# Patient Record
Sex: Male | Born: 1980
Health system: Southern US, Community
[De-identification: ages and names within clinical notes are randomized; demographics above are authoritative.]

## PROBLEM LIST (undated history)

## (undated) DIAGNOSIS — F909 Attention-deficit hyperactivity disorder, unspecified type: Secondary | ICD-10-CM

## (undated) DIAGNOSIS — I10 Essential (primary) hypertension: Secondary | ICD-10-CM

## (undated) HISTORY — DX: Essential (primary) hypertension: I10

## (undated) HISTORY — DX: Attention-deficit hyperactivity disorder, unspecified type: F90.9

---

## 2016-05-17 HISTORY — PX: ROTATOR CUFF REPAIR: SHX139

## 2017-06-01 ENCOUNTER — Emergency Department
Admission: EM | Admit: 2017-06-01 | Discharge: 2017-06-01 | Disposition: A | Discharge status: Home / Self Care | Payer: BLUE CROSS/BLUE SHIELD | Source: Home / Self Care | Attending: Family Medicine | Admitting: Family Medicine

## 2017-06-01 ENCOUNTER — Encounter: Payer: Self-pay | Admitting: Emergency Medicine

## 2017-06-01 ENCOUNTER — Other Ambulatory Visit: Payer: Self-pay

## 2017-06-01 ENCOUNTER — Emergency Department (INDEPENDENT_AMBULATORY_CARE_PROVIDER_SITE_OTHER): Payer: BLUE CROSS/BLUE SHIELD

## 2017-06-01 DIAGNOSIS — M79672 Pain in left foot: Secondary | ICD-10-CM

## 2017-06-01 DIAGNOSIS — S90222A Contusion of left lesser toe(s) with damage to nail, initial encounter: Secondary | ICD-10-CM

## 2017-06-01 DIAGNOSIS — M79675 Pain in left toe(s): Secondary | ICD-10-CM

## 2017-06-01 NOTE — ED Triage Notes (Signed)
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

## 2017-06-01 NOTE — ED Provider Notes (Signed)
[facility]    CSN: [id number] Arrival date & time: 06/01/17  1309     History   Chief Complaint Chief Complaint  Patient presents with  . Toe Injury    HPI Salley SlaughterDavid L Engen is a 37 y.o. male.   HPI  Salley SlaughterDavid L Malizia is a 37 y.o. male presenting to UC with c/o Left 4th toe pain, swelling, and bruising that started about 3 days ago after accidentally hitting it on a piece of luggage.  Pain is worse with ambulation, 6/10.  No prior fracture or surgery to same toe.  He has tried ice, heat, Tylenol and Motrin with mild relief.    History reviewed. No pertinent past medical history.  There are no active problems to display for this patient.   History reviewed. No pertinent surgical history.     Home Medications    Prior to Admission medications   Not on File    Family History History reviewed. No pertinent family history.  Social History Social History   Tobacco Use  . Smoking status: Never Smoker  . Smokeless tobacco: Never Used  Substance Use Topics  . Alcohol use: No    Frequency: Never  . Drug use: No     Allergies   Patient has no known allergies.   Review of Systems Review of Systems  Musculoskeletal: Positive for arthralgias and joint swelling. Negative for myalgias.  Skin: Positive for color change. Negative for rash and wound.  Neurological: Negative for weakness and numbness.     Physical Exam Triage Vital Signs ED Triage Vitals  Enc Vitals Group     BP 06/01/17 1335 135/72     Pulse Rate 06/01/17 1335 (!) 102     Resp --      Temp 06/01/17 1335 97.9 F (36.6 C)     Temp Source 06/01/17 1335 Oral     SpO2 06/01/17 1335 98 %     Weight 06/01/17 1336 272 lb (123.4 kg)     Height 06/01/17 1336 6\' 1"  (1.854 m)     Head Circumference --      Peak Flow --      Pain Score 06/01/17 1337 6     Pain Loc --      Pain Edu? --      Excl. in GC? --    No data found.  Updated Vital Signs BP 135/72 (BP Location: Right  Arm)   Pulse (!) 102   Temp 97.9 F (36.6 C) (Oral)   Ht 6\' 1"  (1.854 m)   Wt 272 lb (123.4 kg)   SpO2 98%   BMI 35.89 kg/m   Visual Acuity Right Eye Distance:   Left Eye Distance:   Bilateral Distance:    Right Eye Near:   Left Eye Near:    Bilateral Near:     Physical Exam  Constitutional: He is oriented to person, place, and time. He appears well-developed and well-nourished. No distress.  HENT:  Head: Normocephalic and atraumatic.  Eyes: EOM are normal.  Neck: Normal range of motion.  Cardiovascular: Normal rate.  Pulmonary/Chest: Effort normal.  Musculoskeletal: Normal range of motion. He exhibits edema and tenderness.  Left 4th toe: mild edema, tenderness to distal aspect. No obvious deformity. Full ROM  Neurological: He is alert and oriented to person, place, and time.  Skin: Skin is warm and dry. Capillary refill takes less than 2 seconds. He is not diaphoretic.  Left 4th toe: skin in tact. Ecchymosis noted  to distal aspect.   Psychiatric: He has a normal mood and affect. His behavior is normal.  Nursing note and vitals reviewed.    UC Treatments / Results  Labs (all labs ordered are listed, but only abnormal results are displayed) Labs Reviewed - No data to display  EKG  EKG Interpretation None       Radiology Dg Foot Complete Left  Result Date: 06/01/2017 CLINICAL DATA:  Pain after kicking suitcase EXAM: LEFT FOOT - COMPLETE 3+ VIEW COMPARISON:  None. FINDINGS: Frontal, oblique, and lateral views were obtained. There is no evident fracture or dislocation. Joint spaces appear normal. There is bony overgrowth along the dorsal midfoot. No erosive change. IMPRESSION: Bony overgrowth along dorsal midfoot. No fracture or dislocation. No appreciable joint space narrowing or erosion. Electronically Signed   By: Bretta Bang III M.D.   On: 06/01/2017 13:55    Procedures Procedures (including critical care time)  Medications Ordered in UC Medications -  No data to display   Initial Impression / Assessment and Plan / UC Course  I have reviewed the triage vital signs and the nursing notes.  Pertinent labs & imaging results that were available during my care of the patient were reviewed by me and considered in my medical decision making (see chart for details).     Hx of Left 4th toe pain, bruising and swelling noted on exam. No evidence of fracture or dislocation.   Final Clinical Impressions(s) / UC Diagnoses   Final diagnoses:  Toenail bruise, left, initial encounter  Pain of toe of left foot    ED Discharge Orders    None       Controlled Substance Prescriptions  Controlled Substance Registry consulted? Not Applicable   Rolla Plate 06/01/17 1610

## 2018-03-29 ENCOUNTER — Emergency Department (HOSPITAL_COMMUNITY)
Admission: EM | Admit: 2018-03-29 | Discharge: 2018-03-29 | Disposition: A | Discharge status: Home / Self Care | Payer: Federal, State, Local not specified - PPO | Attending: Emergency Medicine | Admitting: Emergency Medicine

## 2018-03-29 ENCOUNTER — Emergency Department (HOSPITAL_COMMUNITY): Payer: Federal, State, Local not specified - PPO

## 2018-03-29 ENCOUNTER — Other Ambulatory Visit: Payer: Self-pay

## 2018-03-29 DIAGNOSIS — S61111A Laceration without foreign body of right thumb with damage to nail, initial encounter: Secondary | ICD-10-CM | POA: Insufficient documentation

## 2018-03-29 DIAGNOSIS — Y929 Unspecified place or not applicable: Secondary | ICD-10-CM | POA: Insufficient documentation

## 2018-03-29 DIAGNOSIS — W268XXA Contact with other sharp object(s), not elsewhere classified, initial encounter: Secondary | ICD-10-CM | POA: Diagnosis not present

## 2018-03-29 DIAGNOSIS — Y939 Activity, unspecified: Secondary | ICD-10-CM | POA: Insufficient documentation

## 2018-03-29 DIAGNOSIS — S62525A Nondisplaced fracture of distal phalanx of left thumb, initial encounter for closed fracture: Secondary | ICD-10-CM | POA: Diagnosis not present

## 2018-03-29 DIAGNOSIS — S61011A Laceration without foreign body of right thumb without damage to nail, initial encounter: Secondary | ICD-10-CM | POA: Diagnosis not present

## 2018-03-29 DIAGNOSIS — Y999 Unspecified external cause status: Secondary | ICD-10-CM | POA: Insufficient documentation

## 2018-03-29 DIAGNOSIS — S62521B Displaced fracture of distal phalanx of right thumb, initial encounter for open fracture: Secondary | ICD-10-CM | POA: Diagnosis not present

## 2018-03-29 HISTORY — PX: OTHER SURGICAL HISTORY: SHX169

## 2018-03-29 MED ORDER — MORPHINE SULFATE (PF) 4 MG/ML IV SOLN
4.0000 mg | Freq: Once | INTRAVENOUS | Status: AC
  Administered 2018-03-29: 4 mg via INTRAVENOUS
  Filled 2018-03-29: qty 1

## 2018-03-29 MED ORDER — CEFAZOLIN SODIUM-DEXTROSE 1-4 GM/50ML-% IV SOLN
1.0000 g | Freq: Once | INTRAVENOUS | Status: AC
  Administered 2018-03-29: 1 g via INTRAVENOUS
  Filled 2018-03-29: qty 50

## 2018-03-29 MED ORDER — LIDOCAINE HCL 2 % IJ SOLN
10.0000 mL | Freq: Once | INTRAMUSCULAR | Status: AC
  Administered 2018-03-29: 200 mg
  Filled 2018-03-29: qty 20

## 2018-03-29 MED ORDER — OXYCODONE HCL 5 MG PO TABS: 0.0000 mg | ORAL_TABLET | Freq: Four times a day (QID) | ORAL | 0 refills | Status: DC | PRN

## 2018-03-29 MED ORDER — CEPHALEXIN 500 MG PO CAPS: 500.0000 mg | ORAL_CAPSULE | Freq: Four times a day (QID) | ORAL | 0 refills | Status: AC

## 2018-03-29 MED ORDER — IBUPROFEN 200 MG PO TABS: 600.0000 mg | ORAL_TABLET | Freq: Four times a day (QID) | ORAL | Status: DC

## 2018-03-29 MED ORDER — BUPIVACAINE HCL (PF) 0.5 % IJ SOLN
10.0000 mL | Freq: Once | INTRAMUSCULAR | Status: AC
  Administered 2018-03-29: 10 mL
  Filled 2018-03-29: qty 30

## 2018-03-29 MED ORDER — ACETAMINOPHEN 325 MG PO TABS: 650.0000 mg | ORAL_TABLET | Freq: Four times a day (QID) | ORAL | Status: DC

## 2018-03-29 MED ORDER — IBUPROFEN 200 MG PO TABS
600.0000 mg | ORAL_TABLET | Freq: Once | ORAL | Status: AC
  Administered 2018-03-29: 600 mg via ORAL
  Filled 2018-03-29: qty 3

## 2018-03-29 MED ORDER — ONDANSETRON HCL 4 MG/2ML IJ SOLN
4.0000 mg | Freq: Once | INTRAMUSCULAR | Status: AC
  Administered 2018-03-29: 4 mg via INTRAVENOUS
  Filled 2018-03-29: qty 2

## 2018-03-29 NOTE — ED Triage Notes (Signed)
Pt arriving from home with laceration to right thumb. Pt has string tied around thumb at this time to hold pressure.

## 2018-03-29 NOTE — Discharge Instructions (Signed)
Discharge Instructions ° ° °You have a dressing with a splint incorporated in it. °Move your fingers as much as possible, making a full fist and fully opening the fist. °Elevate your hand to reduce pain & swelling of the digits.  Ice over the operative site may be helpful to reduce pain & swelling.  DO NOT USE HEAT. °Leave the dressing in place until you return to our office.  °You may shower, but keep the bandage clean & dry.  °You may drive a car when you are off of prescription pain medications and can safely control your vehicle with both hands. °Our office will call you to arrange follow-up ° ° °Please call [phone] during normal business hours or [phone] after hours for any problems. Including the following: ° °- excessive redness of the incisions °- drainage for more than 4 days °- fever of more than 101.5 F ° °*Please note that pain medications will not be refilled after hours or on weekends. ° ° °

## 2018-03-29 NOTE — ED Provider Notes (Signed)
[facility]-EMERGENCY DEPT Provider Note   CSN: [id number] Arrival date & time: 03/29/18  1856     History   Chief Complaint Chief Complaint  Patient presents with  . Laceration    right thumb    HPI Brett Brown is a 37 y.o. male.  HPI   Brett SlaughterDavid L Service is a 37 y.o. male, patient with no pertinent past medical history, presenting to the ED with laceration to the right thumb that occurred around 5:30 PM today. He was repairing the fan of a [employer] when it turned on.  He placed a tourniquet made from a shoestring and was able to control the bleeding. Pain is severe, sharp, nonradiating.  Tetanus updated 3 years ago. Denies numbness, other injuries.   No past medical history on file.  There are no active problems to display for this patient.   No past surgical history on file.      Home Medications    Prior to Admission medications   Medication Sig Start Date End Date Taking? Authorizing Provider  acetaminophen (TYLENOL) 325 MG tablet Take 2 tablets (650 mg total) by mouth every 6 (six) hours. 03/29/18   Mack Hookhompson, Jacobi, MD  cephALEXin (KEFLEX) 500 MG capsule Take 1 capsule (500 mg total) by mouth 4 (four) times daily for 5 days. 03/29/18 04/03/18  Mack Hookhompson, Eber, MD  ibuprofen (ADVIL) 200 MG tablet Take 3 tablets (600 mg total) by mouth every 6 (six) hours. 03/29/18   Mack Hookhompson, Djimon, MD  oxyCODONE (ROXICODONE) 5 MG immediate release tablet Take 0-1 tablets (0-5 mg total) by mouth every 6 (six) hours as needed for severe pain. 03/29/18   Mack Hookhompson, Azad, MD    Family History No family history on file.  Social History Social History   Tobacco Use  . Smoking status: Never Smoker  . Smokeless tobacco: Never Used  Substance Use Topics  . Alcohol use: No    Frequency: Never  . Drug use: No     Allergies   Patient has no known allergies.   Review of Systems Review of Systems  Skin: Positive for wound.  Neurological:  Negative for numbness.     Physical Exam Updated Vital Signs BP (!) 160/109 (BP Location: Left Arm)   Pulse (!) 123   Temp 97.9 F (36.6 C) (Oral)   Resp 18   SpO2 97%   Physical Exam  Constitutional: He appears well-developed and well-nourished. No distress.  HENT:  Head: Normocephalic and atraumatic.  Eyes: Conjunctivae are normal.  Neck: Neck supple.  Cardiovascular: Normal rate, regular rhythm and intact distal pulses.  Pulmonary/Chest: Effort normal.  Musculoskeletal:  Severe laceration with partial amputation/avulsion of distal thumb section on the right hand.  Neurological: He is alert.  Patient has sensation grossly intact to light touch throughout the right thumb.  Motor function intact at the IP and MCP joints.  Skin: Skin is warm and dry. Capillary refill takes less than 2 seconds. He is not diaphoretic. No pallor.  Psychiatric: He has a normal mood and affect. His behavior is normal.  Nursing note and vitals reviewed.                   ED Treatments / Results  Labs (all labs ordered are listed, but only abnormal results are displayed) Labs Reviewed - No data to display  EKG None  Radiology Dg Finger Thumb Right  Result Date: 03/29/2018 CLINICAL DATA:  Right thumb laceration EXAM: RIGHT THUMB 2+V COMPARISON:  None. FINDINGS: Soft tissue laceration of right thumb. Oblique displaced fracture of the radial aspect of the first distal phalanx involving the tuft. 4 mm of radial and 4 mm of volar displacement of the major fracture fragment. No other fracture or dislocation. IMPRESSION: 1. Oblique displaced fracture of the radial aspect of the first distal phalanx involving the tuft. 4 mm of radial and 4 mm of volar displacement of the major fracture fragment. 2. Overlying soft tissue laceration of the right thumb. Electronically Signed   By: Elige Ko   On: 03/29/2018 20:53    Procedures .Nerve Block Date/Time: 03/29/2018 7:50 PM Performed by:  Anselm Pancoast, PA-C Authorized by: Anselm Pancoast, PA-C   Consent:    Consent obtained:  Verbal   Consent given by:  Patient   Risks discussed:  Bleeding, infection, pain, swelling and unsuccessful block Indications:    Indications:  Pain relief and procedural anesthesia Location:    Body area:  Upper extremity   Upper extremity nerve blocked: Digital; thumb.   Laterality:  Right Pre-procedure details:    Skin preparation:  Alcohol Procedure details (see MAR for exact dosages):    Block needle gauge:  25 G   Anesthetic injected:  Bupivacaine 0.5% w/o epi   Injection procedure:  Anatomic landmarks identified, anatomic landmarks palpated, incremental injection, introduced needle and negative aspiration for blood Post-procedure details:    Outcome:  Anesthesia achieved   Patient tolerance of procedure:  Tolerated well, no immediate complications   (including critical care time)  Medications Ordered in ED Medications  bupivacaine (MARCAINE) 0.5 % injection 10 mL (10 mLs Infiltration Given by Other 03/29/18 1950)  ceFAZolin (ANCEF) IVPB 1 g/50 mL premix (0 g Intravenous Stopped 03/29/18 2152)  lidocaine (XYLOCAINE) 2 % (with pres) injection 200 mg (200 mg Infiltration Given by Other 03/29/18 2125)  morphine 4 MG/ML injection 4 mg (4 mg Intravenous Given 03/29/18 2226)  ibuprofen (ADVIL,MOTRIN) tablet 600 mg (600 mg Oral Given 03/29/18 2226)  ondansetron (ZOFRAN) injection 4 mg (4 mg Intravenous Given 03/29/18 2226)     Initial Impression / Assessment and Plan / ED Course  I have reviewed the triage vital signs and the nursing notes.  Pertinent labs & imaging results that were available during my care of the patient were reviewed by me and considered in my medical decision making (see chart for details).  Clinical Course as of Mar 30 2303  Wed Mar 29, 2018  2006 Put in call back request for Dr. Janee Morn.   [SJ]  2025 Spoke with Dr. Janee Morn, hand surgeon. States he is on his way  in to evaluate the patient.   [SJ]    Clinical Course User Index [SJ] ,  C, PA-C    Patient presents with severe laceration to the right thumb.  Options for repair were discussed with the patient by Dr. Janee Morn, hand surgeon.  Patient opted for bedside repair, which was performed by Dr. Janee Morn. The patient was given instructions for home care as well as return precautions. Patient voices understanding of these instructions, accepts the plan, and is comfortable with discharge.  Findings and plan of care discussed with Kristine Royal, MD.   Patient's hypertension here is noted.  He does not seem to be symptomatic to it.  He states he is quite anxious about his hand.  He will follow-up with a primary care provider on this matter.   Final Clinical Impressions(s) / ED Diagnoses   Final diagnoses:  Laceration of  right thumb without foreign body with damage to nail, initial encounter    ED Discharge Orders         Ordered    oxyCODONE (ROXICODONE) 5 MG immediate release tablet  Every 6 hours PRN     03/29/18 2149    ibuprofen (ADVIL) 200 MG tablet  Every 6 hours     03/29/18 2149    acetaminophen (TYLENOL) 325 MG tablet  Every 6 hours     03/29/18 2149    cephALEXin (KEFLEX) 500 MG capsule  4 times daily     03/29/18 2149           Anselm Pancoast, PA-C 03/29/18 2308    Wynetta Fines, MD 03/29/18 [phone]

## 2018-03-29 NOTE — Consult Note (Signed)
ORTHOPAEDIC CONSULTATION HISTORY & PHYSICAL REQUESTING PHYSICIAN: Wynetta Fines, MD  Chief Complaint: R thumb injury  HPI: Brett Brown is a 37 y.o. 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  At the time of injury, he placed a tourniquet about the thumb to prevent blood loss and arrived to the ED via POV.  No past medical history on file. No past surgical history on file. Social History   Socioeconomic History  . Marital status: Single    Spouse name: Not on file  . Number of children: Not on file  . Years of education: Not on file  . Highest education level: Not on file  Occupational History  . Not on file  Social Needs  . Financial resource strain: Not on file  . Food insecurity:    Worry: Not on file    Inability: Not on file  . Transportation needs:    Medical: Not on file    Non-medical: Not on file  Tobacco Use  . Smoking status: Never Smoker  . Smokeless tobacco: Never Used  Substance and Sexual Activity  . Alcohol use: No    Frequency: Never  . Drug use: No  . Sexual activity: Not on file  Lifestyle  . Physical activity:    Days per week: Not on file    Minutes per session: Not on file  . Stress: Not on file  Relationships  . Social connections:    Talks on phone: Not on file    Gets together: Not on file    Attends religious service: Not on file    Active member of club or organization: Not on file    Attends meetings of clubs or organizations: Not on file    Relationship status: Not on file  Other Topics Concern  . Not on file  Social History Narrative  . Not on file   No family history on file. No Known Allergies Prior to Admission medications   Medication Sig Start Date End Date Taking? Authorizing Provider  ibuprofen (ADVIL,MOTRIN) 200 MG tablet Take 800 mg by mouth 2 (two) times daily as needed  for moderate pain.   Yes [provider]   Dg Finger Thumb Right  Result Date: 03/29/2018 CLINICAL DATA:  Right thumb laceration EXAM: RIGHT THUMB 2+V COMPARISON:  None. FINDINGS: Soft tissue laceration of right thumb. Oblique displaced fracture of the radial aspect of the first distal phalanx involving the tuft. 4 mm of radial and 4 mm of volar displacement of the major fracture fragment. No other fracture or dislocation. IMPRESSION: 1. Oblique displaced fracture of the radial aspect of the first distal phalanx involving the tuft. 4 mm of radial and 4 mm of volar displacement of the major fracture fragment. 2. Overlying soft tissue laceration of the right thumb. Electronically Signed   By: Elige Ko   On: 03/29/2018 20:53    Positive ROS: All other systems have been reviewed and were otherwise negative with the exception of those mentioned in the HPI and as above.  Physical Exam: Vitals: Refer to EMR. Constitutional:  WD, WN, NAD HEENT:  NCAT, EOMI Neuro/Psych:  Alert & oriented to person, place, and time; appropriate mood & affect Lymphatic: No generalized extremity edema or lymphadenopathy Extremities / MSK:  The extremities are normal with respect to appearance, ranges of motion, joint stability, muscle strength/tone, sensation, & perfusion except  as otherwise noted:  Right thumb complex oblique laceration leaving a volar ulnar skin bridge, essentially a near complete amputation, with oblique laceration to the nailbed, distal phalanx, and much of the radial sided skin and subcutaneous tissues.  FPL and extensor tendon intact.  The created flap with has a narrow but proximally based base and is slightly dusky.  Assessment: Complex right thumb wound, essentially incomplete amputation  Plan/Procedure: I discussed these findings with him and options for treatment.  I first presented evaluation and repair in the operating room, likely with pinning of the fracture and repair of the  soft tissues.  He was reluctant to provide consent for this, and instead preferred emergency room treatment.  After obtaining verbal consent, I augmented his digital block and placed the tourniquet at the base of the digit.  The wound was then washed and scrubbed under running water at the sink.  The thumb was then prepped with Betadine and draped in usual sterile fashion.  The nail plate was removed from both the proximal and distal aspects of the wound.  With the nail plate removed, the fracture and soft tissue injuries could be better evaluated.  A 22-gauge needle was used to percutaneously provide fixation for the distal phalanx, after open reduction was performed.  Next, the nailbed was repaired with 6-0 plain gut suture, and the remainder of the traumatic laceration measuring 4 cm in total was repaired with 4-0 Vicryl Rapide interrupted sutures.  In the process, some skin and subcutaneous tissues were excisionally debrided as they were jagged nonviable on the edges.  The resultant appearance of the digit was good, and the stability was reasonable.  The hub was cut off of the needle, the needle bent over the nailbed and Xeroform was placed into the nail fold and along the nailbed as well as used in the dressing for the traumatic closure.  A bulky thumb dressing with a dorsal tongue blade splint was applied.  Tetanus prophylaxis was noted to have been updated 3 years ago.  He received Ancef in the emergency department and will be discharged with Keflex as well as an analgesic plan.  My office will call the patient tomorrow to arrange re-evaluation next week.    Cliffton Astersavid A. Janee Mornhompson, MD      Orthopaedic & Hand Surgery [facility] [address] [place], [place]  [id number] Office: [phone] Mobile: [phone]  03/29/2018, 9:46 PM

## 2018-04-18 DIAGNOSIS — S61111D Laceration without foreign body of right thumb with damage to nail, subsequent encounter: Secondary | ICD-10-CM | POA: Diagnosis not present

## 2018-04-18 DIAGNOSIS — S61111A Laceration without foreign body of right thumb with damage to nail, initial encounter: Secondary | ICD-10-CM | POA: Diagnosis not present

## 2018-04-19 ENCOUNTER — Ambulatory Visit (INDEPENDENT_AMBULATORY_CARE_PROVIDER_SITE_OTHER): Payer: Federal, State, Local not specified - PPO | Admitting: Family Medicine

## 2018-04-19 ENCOUNTER — Encounter: Payer: Self-pay | Admitting: Family Medicine

## 2018-04-19 ENCOUNTER — Telehealth: Payer: Self-pay | Admitting: Family Medicine

## 2018-04-19 VITALS — BP 153/103 | HR 92 | Ht 73.0 in | Wt 304.0 lb

## 2018-04-19 DIAGNOSIS — E669 Obesity, unspecified: Secondary | ICD-10-CM | POA: Insufficient documentation

## 2018-04-19 DIAGNOSIS — I10 Essential (primary) hypertension: Secondary | ICD-10-CM | POA: Diagnosis not present

## 2018-04-19 DIAGNOSIS — F902 Attention-deficit hyperactivity disorder, combined type: Secondary | ICD-10-CM | POA: Diagnosis not present

## 2018-04-19 DIAGNOSIS — F909 Attention-deficit hyperactivity disorder, unspecified type: Secondary | ICD-10-CM | POA: Insufficient documentation

## 2018-04-19 LAB — CBC
HEMATOCRIT: 46.8 % (ref 38.5–50.0)
Hemoglobin: 16.1 g/dL (ref 13.2–17.1)
MCH: 30.2 pg (ref 27.0–33.0)
MCHC: 34.4 g/dL (ref 32.0–36.0)
MCV: 87.8 fL (ref 80.0–100.0)
MPV: 9.2 fL (ref 7.5–12.5)
Platelets: 329 10*3/uL (ref 140–400)
RBC: 5.33 10*6/uL (ref 4.20–5.80)
RDW: 12.5 % (ref 11.0–15.0)
WBC: 11 10*3/uL — ABNORMAL HIGH (ref 3.8–10.8)

## 2018-04-19 LAB — COMPLETE METABOLIC PANEL WITH GFR
AG Ratio: 1.7 (calc) (ref 1.0–2.5)
ALT: 58 U/L — ABNORMAL HIGH (ref 9–46)
AST: 23 U/L (ref 10–40)
Albumin: 4.7 g/dL (ref 3.6–5.1)
Alkaline phosphatase (APISO): 82 U/L (ref 40–115)
BUN: 25 mg/dL (ref 7–25)
CALCIUM: 10.4 mg/dL — AB (ref 8.6–10.3)
CO2: 29 mmol/L (ref 20–32)
CREATININE: 1.24 mg/dL (ref 0.60–1.35)
Chloride: 104 mmol/L (ref 98–110)
GFR, EST AFRICAN AMERICAN: 84 mL/min/{1.73_m2} (ref >=60)
GFR, EST NON AFRICAN AMERICAN: 73 mL/min/{1.73_m2} (ref >=60)
GLOBULIN: 2.7 g/dL (ref 1.9–3.7)
Glucose, Bld: 98 mg/dL (ref 65–99)
Potassium: 4.8 mmol/L (ref 3.5–5.3)
SODIUM: 140 mmol/L (ref 135–146)
TOTAL PROTEIN: 7.4 g/dL (ref 6.1–8.1)
Total Bilirubin: 0.3 mg/dL (ref 0.2–1.2)

## 2018-04-19 MED ORDER — LISINOPRIL 10 MG PO TABS: 10.0000 mg | ORAL_TABLET | Freq: Every day | ORAL | 1 refills | Status: DC

## 2018-04-19 MED ORDER — AMPHETAMINE-DEXTROAMPHET ER 10 MG PO CP24: 10.0000 mg | ORAL_CAPSULE | Freq: Every day | ORAL | 0 refills | Status: DC

## 2018-04-19 NOTE — Telephone Encounter (Signed)
[facility]all [facility] from 03/20/2018 through 04/19/2019. Form sent to scan and pharmacy aware.

## 2018-04-19 NOTE — Patient Instructions (Addendum)
Thank you for coming in today. I think it is very likely that you have ADHD.  Start adderall daily.  Let me know how things are going.  Recheck in 1 month.  For blood pressure take lisinopril daily.  Get labs now.  Recheck in 1 month.   Use a weekly pill box.    Living With Attention Deficit Hyperactivity Disorder If you have been diagnosed with attention deficit hyperactivity disorder (ADHD), you may be relieved that you now know why you have felt or behaved a certain way. Still, you may feel overwhelmed about the treatment ahead. You may also wonder how to get the support you need and how to deal with the condition day-to-day. With treatment and support, you can live with ADHD and manage your symptoms. How to manage lifestyle changes Managing stress Stress is your body's reaction to life changes and events, both good and bad. To cope with the stress of an ADHD diagnosis, it may help to:  Learn more about ADHD.  Exercise regularly. Even a short daily walk can lower stress levels.  Participate in training or education programs (including social skills training classes) that teach you to deal with symptoms.  Medicines Your health care provider may suggest certain medicines if he or she feels that they will help to improve your condition. Stimulant medicines are usually prescribed to treat ADHD, and therapy may also be prescribed. It is important to:  Avoid using alcohol and other substances that may prevent your medicines from working properly [facility](mayinteract).  Talk with your pharmacist or health care provider about all the medicines that you take, their possible side effects, and what medicines are safe to take together.  Make it your goal to take part in all treatment decisions (shared decision-making). Ask about possible side effects of medicines that your health care provider recommends, and tell him or her how you feel about having those side effects. It is best if shared  decision-making with your health care provider is part of your total treatment plan.  Relationships To strengthen your relationships with family members while treating your condition, consider taking part in family therapy. You might also attend self-help groups alone or with a loved one. Be honest about how your symptoms affect your relationships. Make an effort to communicate respectfully instead of fighting, and find ways to show others that you care. Psychotherapy may be useful in helping you cope with how ADHD affects your relationships. How to recognize changes in your condition The following signs may mean that your treatment is working well and your condition is improving:  Consistently being on time for appointments.  Being more organized at home and work.  Other people noticing improvements in your behavior.  Achieving goals that you set for yourself.  Thinking more clearly.  The following signs may mean that your treatment is not working very well:  Feeling impatience or more confusion.  Missing, forgetting, or being late for appointments.  An increasing sense of disorganization and messiness.  More difficulty in reaching goals that you set for yourself.  Loved ones becoming angry or frustrated with you.  Where to find support Talking to others  Keep emotion out of important discussions and speak in a calm, logical way.  Listen closely and patiently to your loved ones. Try to understand their point of view, and try to avoid getting defensive.  Take responsibility for the consequences of your actions.  Ask that others do not take your behaviors personally.  Aim to  solve problems as they come up, and express your feelings instead of bottling them up.  Talk openly about what you need from your loved ones and how they can support you.  Consider going to family therapy sessions or having your family meet with a specialist who deals with ADHD-related behavior  problems. Finances Not all insurance plans cover mental health care, so it is important to check with your insurance carrier. If paying for co-pays or counseling services is a problem, search for a local or county mental health care center. Public mental health care services may be offered there at a low cost or no cost when you are not able to see a private health care provider. If you are taking medicine for ADHD, you may be able to get the generic form, which may be less expensive than brand-name medicine. Some makers of prescription medicines also offer help to patients who cannot afford the medicines that they need. Follow these instructions at home:  Take over-the-counter and prescription medicines only as told by your health care provider. Check with your health care provider before taking any new medicines.  Create structure and an organized atmosphere at home. For example: ? Make a list of tasks, then rank them from most important to least important. Work on one task at a time until your listed tasks are done. ? Make a daily schedule and follow it consistently every day. ? Use an appointment calendar, and check it 2 or 3 times a day to keep on track. Keep it with you when you leave the house. ? Create spaces where you keep certain things, and always put things back in their places after you use them.  Keep all follow-up visits as told by your health care provider. This is important. Questions to ask your health care provider:  What are the risks and benefits of taking medicines?  Would I benefit from therapy?  How often should I follow up with a health care provider? Contact a health care provider if:  You have side effects from your medicines, such as: ? Repeated muscle twitches, coughing, or speech outbursts. ? Sleep problems. ? Loss of appetite. ? Depression. ? New or worsening behavior problems. ? Dizziness. ? Unusually fast heartbeat. ? Stomach pains. ? Headaches. Get  help right away if:  You have a severe reaction to a medicine.  Your behavior suddenly gets worse. Summary  With treatment and support, you can live with ADHD and manage your symptoms.  The medicines that are most often prescribed for ADHD are stimulants.  Consider taking part in family therapy or self-help groups with family members or friends.  When you talk with friends and family about your ADHD, be patient and communicate openly.  Take over-the-counter and prescription medicines only as told by your health care provider. Check with your health care provider before taking any new medicines. This information is not intended to replace advice given to you by your health care provider. Make sure you discuss any questions you have with your health care provider. Document Released: 09/02/2016 Document Revised: 09/02/2016 Document Reviewed: 09/02/2016 Elsevier Interactive Patient Education  2018 [employer].   Lisinopril tablets What is this medicine? LISINOPRIL (lyse IN oh pril) is an ACE inhibitor. This medicine is used to treat high blood pressure and heart failure. It is also used to protect the heart immediately after a heart attack. This medicine may be used for other purposes; ask your health care provider or pharmacist if you have  questions. COMMON BRAND NAME(S): Prinivil, Zestril What should I tell my health care provider before I take this medicine? They need to know if you have any of these conditions: -diabetes -heart or blood vessel disease -kidney disease -low blood pressure -previous swelling of the tongue, face, or lips with difficulty breathing, difficulty swallowing, hoarseness, or tightening of the throat -an unusual or allergic reaction to lisinopril, other ACE inhibitors, insect venom, foods, dyes, or preservatives -pregnant or trying to get pregnant -breast-feeding How should I use this medicine? Take this medicine by mouth with a glass of water. Follow the  directions on your prescription label. You may take this medicine with or without food. If it upsets your stomach, take it with food. Take your medicine at regular intervals. Do not take it more often than directed. Do not stop taking except on your doctor's advice. Talk to your pediatrician regarding the use of this medicine in children. Special care may be needed. While this drug may be prescribed for children as young as 46 years of age for selected conditions, precautions do apply. Overdosage: If you think you have taken too much of this medicine contact a poison control center or emergency room at once. NOTE: This medicine is only for you. Do not share this medicine with others. What if I miss a dose? If you miss a dose, take it as soon as you can. If it is almost time for your next dose, take only that dose. Do not take double or extra doses. What may interact with this medicine? Do not take this medicine with any of the following medications: -hymenoptera venom -sacubitril; valsartan This medicines may also interact with the following medications: -aliskiren -angiotensin receptor blockers, like losartan or valsartan -certain medicines for diabetes -diuretics -everolimus -gold compounds -lithium -NSAIDs, medicines for pain and inflammation, like ibuprofen or naproxen -potassium salts or supplements -salt substitutes -sirolimus -temsirolimus This list may not describe all possible interactions. Give your health care provider a list of all the medicines, herbs, non-prescription drugs, or dietary supplements you use. Also tell them if you smoke, drink alcohol, or use illegal drugs. Some items may interact with your medicine. What should I watch for while using this medicine? Visit your doctor or health care professional for regular check ups. Check your blood pressure as directed. Ask your doctor what your blood pressure should be, and when you should contact him or her. Do not treat  yourself for coughs, colds, or pain while you are using this medicine without asking your doctor or health care professional for advice. Some ingredients may increase your blood pressure. Women should inform their doctor if they wish to become pregnant or think they might be pregnant. There is a potential for serious side effects to an unborn child. Talk to your health care professional or pharmacist for more information. Check with your doctor or health care professional if you get an attack of severe diarrhea, nausea and vomiting, or if you sweat a lot. The loss of too much body fluid can make it dangerous for you to take this medicine. You may get drowsy or dizzy. Do not drive, use machinery, or do anything that needs mental alertness until you know how this drug affects you. Do not stand or sit up quickly, especially if you are an older patient. This reduces the risk of dizzy or fainting spells. Alcohol can make you more drowsy and dizzy. Avoid alcoholic drinks. Avoid salt substitutes unless you are told otherwise by your doctor  or health care professional. What side effects may I notice from receiving this medicine? Side effects that you should report to your doctor or health care professional as soon as possible: -allergic reactions like skin rash, itching or hives, swelling of the hands, feet, face, lips, throat, or tongue -breathing problems -signs and symptoms of kidney injury like trouble passing urine or change in the amount of urine -signs and symptoms of increased potassium like muscle weakness; chest pain; or fast, irregular heartbeat -signs and symptoms of liver injury like dark yellow or brown urine; general ill feeling or flu-like symptoms; light-colored stools; loss of appetite; nausea; right upper belly pain; unusually weak or tired; yellowing of the eyes or skin -signs and symptoms of low blood pressure like dizziness; feeling faint or lightheaded, falls; unusually weak or  tired -stomach pain with or without nausea and vomiting Side effects that usually do not require medical attention (report to your doctor or health care professional if they continue or are bothersome): -changes in taste -cough -dizziness -fever -headache -sensitivity to light This list may not describe all possible side effects. Call your doctor for medical advice about side effects. You may report side effects to FDA at 1-800-FDA-1088. Where should I keep my medicine? Keep out of the reach of children. Store at room temperature between 15 and 30 degrees C (59 and 86 degrees F). Protect from moisture. Keep container tightly closed. Throw away any unused medicine after the expiration date. NOTE: This sheet is a summary. It may not cover all possible information. If you have questions about this medicine, talk to your doctor, pharmacist, or health care provider.  2018 Elsevier/Gold Standard (2015-06-23 12:52:35)

## 2018-04-19 NOTE — Progress Notes (Signed)
Brett Brown is a 37 y.o. male who presents to [facility] today for establish care and discuss possible ADHD, hypertension and obesity.  ADHD: Brett Brown was diagnosed with ADHD as a child in [place].  His parents were reluctant to use medication so he never did take medicine.  He had trouble throughout school with inattention and had to take special classes in school.  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  He notes difficulty both at home and at work with concentration and completing boring or challenging tasks.  He notes he has trouble listening to people and problems with fidgeting and hyperactivity and impulsivity at home as well.  His symptoms are causing a problem and he is interested in considering medication.  Additionally he has had a history of hypertension in the past.  He has had multiple elevated blood pressures in the past including at work.  He notes he is nervous at the doctor's office and notes that his blood pressure is typically higher at the doctor's office than at home.  He does note at home his blood pressures typically 140s over 90s.  No chest pain palpitations or shortness of breath  Obesity: Brett Brown notes that he is had trouble losing weight and has had weight gain recently.  He is not currently exercising much.  He plans to start exercising.  He does not have a careful diet.   ROS as above: No headache, visual changes, nausea, vomiting, diarrhea, constipation, dizziness, abdominal pain, skin rash, fevers, chills, night sweats, weight loss, swollen lymph nodes, body aches, joint swelling, muscle aches, chest pain, shortness of breath, mood changes, visual or auditory hallucinations.    Exam:  BP (!) 153/103   Pulse 92   Ht 6\' 1"  (1.854 m)   Wt (!) 304 lb (137.9 kg)   BMI 40.11 kg/m  Wt Readings from Last 5  Encounters:  04/19/18 (!) 304 lb (137.9 kg)  06/01/17 272 lb (123.4 kg)    Gen: Well NAD HEENT: EOMI,  MMM Lungs: Normal work of breathing. CTABL Heart: RRR no MRG Abd: NABS, Soft. Nondistended, Nontender Exts: Brisk capillary refill, warm and well perfused.  Bandage right thumb Psych alert and oriented normal speech thought process and affect.  Mild fidgeting present.  Depression screen PHQ 2/9 04/19/2018  Decreased Interest 0  Down, Depressed, Hopeless 0  PHQ - 2 Score 0  Altered sleeping 0  Tired, decreased energy 1  Change in appetite 1  Feeling bad or failure about yourself  0  Trouble concentrating 3  Moving slowly or fidgety/restless 0  Suicidal thoughts 0  PHQ-9 Score 5  Difficult doing work/chores Not difficult at all    GAD 7 : Generalized Anxiety Score 04/19/2018  Nervous, Anxious, on Edge 0  Control/stop worrying 0  Worry too much - different things 3  Trouble relaxing 3  Restless 0  Easily annoyed or irritable 0  Afraid - awful might happen 0  Total GAD 7 Score 6  Anxiety Difficulty Not difficult at all     Adult ADHD Self Report Scale (most recent)    Adult ADHD Self-Report Scale (ASRS-v1.1) Symptom Checklist - 04/19/18 0941      Part A   1. How often do you have trouble wrapping up the final details of a project, once the challenging parts have been done?  (!) Very Often  2. How often  do you have difficulty getting things done in order when you have to do a task that requires organization?  (!) Very Often    3. How often do you have problems remembering appointments or obligations?  (!) Very Often  4. When you have a task that requires a lot of thought, how often do you avoid or delay getting started?  (!) Very Often    5. How often do you fidget or squirm with your hands or feet when you have to sit down for a long time?  (!) Very Often  6. How often do you feel overly active and compelled to do things, like you were driven by a motor?  (!) Very Often        Part B   7. How often do you make careless mistakes when you have to work on a boring or difficult project?  (!) Often  8. How often do you have difficulty keeping your attention when you are doing boring or repetitive work?  (!) Very Often    9. How often do you have difficulty concentrating on what people say to you, even when they are speaking to you directly?  (!) Very Often  10. How often do you misplace or have difficulty finding things at home or at work?  (!) Very Often    11. How often are you distracted by activity or noise around you?  (!) Very Often  12. How often do you leave your seat in meetings or other situations in which you are expected to remain seated?  (!) Often    13. How often do you feel restless or fidgety?  (!) Very Often  14. How often do you have difficulty unwinding and relaxing when you have time to yourself?  (!) Often    15. How often do you find yourself talking too much when you are in social situations?  Never  16. When you are in a conversation, how often do you find yourself finishing the sentences of the people you are talking to, before they can finish them themselves?  Never    17. How often do you have difficulty waiting your turn in situations when turn taking is required?  Never  18. How often do you interrupt others when they are busy?  (!) Often      Comment   How old were you when these problems first began to occur?  4          Assessment and Plan: 37 y.o. male with  ADHD: Very likely ADHD.  Prior history of ADHD diagnosed as a child and very positive adult self reporting score.  Discussed options.  Plan to start extended release Adderall and recheck in 1 month.  Lengthy discussion about goals of treatment and potential side effects.  Hypertension: First diagnosis today however patient has a long history of elevated blood pressures.  Plan to start lisinopril after lengthy discussion.  Will check metabolic panel today recheck in 1 month along with  fasting labs.  Plan to start lisinopril 10 mg daily and titrate as needed.  I think patient may also have sleep apnea as well and will start working this up in the near future as well.  Obesity: Patient is morbidly obese which is a central medical problem likely causing her contributing to his hypertension.  Plan to work on weight loss strategies in the near future.  Brief dietary discussion today.   Orders Placed This Encounter  Procedures  .  COMPLETE METABOLIC PANEL WITH GFR  . CBC   Meds ordered this encounter  Medications  . lisinopril (PRINIVIL,ZESTRIL) 10 MG tablet    Sig: Take 1 tablet (10 mg total) by mouth daily.    Dispense:  30 tablet    Refill:  1  . amphetamine-dextroamphetamine (ADDERALL XR) 10 MG 24 hr capsule    Sig: Take 1 capsule (10 mg total) by mouth daily.    Dispense:  30 capsule    Refill:  0     Historical information moved to improve visibility of documentation.  Past Medical History:  Diagnosis Date  . ADHD   . Hypertension    Past Surgical History:  Procedure Laterality Date  . ROTATOR CUFF REPAIR Right 2018  . 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 Smoking status: Never Smoker  . Smokeless tobacco: Never Used  Substance Use Topics  . Alcohol use: No    Frequency: Never   family history includes Cancer (age of onset: 7174) in his father; Hypertension in his father.  Medications: Current Outpatient Medications  Medication Sig Dispense Refill  . acetaminophen (TYLENOL) 325 MG tablet Take 2 tablets (650 mg total) by mouth every 6 (six) hours.    Marland Kitchen. ibuprofen (ADVIL) 200 MG tablet Take 3 tablets (600 mg total) by mouth every 6 (six) hours.    Marland Kitchen. amphetamine-dextroamphetamine (ADDERALL XR) 10 MG 24 hr capsule Take 1 capsule (10 mg total) by mouth daily. 30 capsule 0  . lisinopril (PRINIVIL,ZESTRIL) 10 MG tablet Take 1 tablet (10 mg total) by mouth daily. 30 tablet 1   No  current facility-administered medications for this visit.    No Known Allergies   Discussed warning signs or symptoms. Please see discharge instructions. Patient expresses understanding.

## 2018-04-25 ENCOUNTER — Telehealth: Payer: Self-pay

## 2018-04-25 NOTE — Telephone Encounter (Signed)
Patient called stated that his Adderall 10 mg is not working for him. He is requesting a higher dose. Please advise. Jaydalee Bardwell,CMA

## 2018-04-26 MED ORDER — AMPHETAMINE-DEXTROAMPHET ER 20 MG PO CP24: 20.0000 mg | ORAL_CAPSULE | Freq: Every day | ORAL | 0 refills | Status: DC

## 2018-04-26 NOTE — Telephone Encounter (Signed)
Left VM with recommendation  

## 2018-04-26 NOTE — Telephone Encounter (Signed)
20 mg dose sent to pharmacy.  Okay to take 2 of the 10's today and see if it makes any difference.

## 2018-05-02 DIAGNOSIS — S61111A Laceration without foreign body of right thumb with damage to nail, initial encounter: Secondary | ICD-10-CM | POA: Diagnosis not present

## 2018-05-02 DIAGNOSIS — S61111D Laceration without foreign body of right thumb with damage to nail, subsequent encounter: Secondary | ICD-10-CM | POA: Diagnosis not present

## 2018-05-08 ENCOUNTER — Telehealth: Payer: Self-pay

## 2018-05-08 NOTE — Telephone Encounter (Signed)
Patient called stated that the dosage of Adderall that he is taking is not working. Patient is requesting an increase in his medication.please advise if patient needs an appointment. Rhonda Cunningham,CMA

## 2018-05-08 NOTE — Telephone Encounter (Signed)
Left VM with recommendation  

## 2018-05-08 NOTE — Telephone Encounter (Signed)
Changing dosage of medication for controlled substances should be discussed with primary care physician.  He will have to wait until Dr. Denyse Amassorey is back in the office

## 2018-05-11 MED ORDER — AMPHETAMINE-DEXTROAMPHET ER 30 MG PO CP24: 30.0000 mg | ORAL_CAPSULE | Freq: Every day | ORAL | 0 refills | Status: DC

## 2018-05-11 NOTE — Telephone Encounter (Signed)
Adderall increased to 30 mg.

## 2018-05-11 NOTE — Telephone Encounter (Signed)
LM on VM that med was increased and to check his pharmacy. KG LPN

## 2018-05-24 ENCOUNTER — Encounter: Payer: Self-pay | Admitting: Family Medicine

## 2018-05-24 ENCOUNTER — Ambulatory Visit (INDEPENDENT_AMBULATORY_CARE_PROVIDER_SITE_OTHER): Payer: Federal, State, Local not specified - PPO | Admitting: Family Medicine

## 2018-05-24 VITALS — BP 138/86 | HR 85 | Ht 73.0 in | Wt 288.0 lb

## 2018-05-24 DIAGNOSIS — F902 Attention-deficit hyperactivity disorder, combined type: Secondary | ICD-10-CM | POA: Diagnosis not present

## 2018-05-24 DIAGNOSIS — I10 Essential (primary) hypertension: Secondary | ICD-10-CM | POA: Diagnosis not present

## 2018-05-24 LAB — CBC
HEMATOCRIT: 43.3 % (ref 38.5–50.0)
HEMOGLOBIN: 15.1 g/dL (ref 13.2–17.1)
MCH: 30.4 pg (ref 27.0–33.0)
MCHC: 34.9 g/dL (ref 32.0–36.0)
MCV: 87.3 fL (ref 80.0–100.0)
MPV: 9.2 fL (ref 7.5–12.5)
Platelets: 307 10*3/uL (ref 140–400)
RBC: 4.96 10*6/uL (ref 4.20–5.80)
RDW: 12.3 % (ref 11.0–15.0)
WBC: 8.5 10*3/uL (ref 3.8–10.8)

## 2018-05-24 LAB — LIPID PANEL W/REFLEX DIRECT LDL
CHOLESTEROL: 165 mg/dL (ref <200)
HDL: 37 mg/dL — ABNORMAL LOW (ref >40)
LDL CHOLESTEROL (CALC): 107 mg/dL — AB
Non-HDL Cholesterol (Calc): 128 mg/dL (calc) (ref <130)
Total CHOL/HDL Ratio: 4.5 (calc) (ref <5.0)
Triglycerides: 116 mg/dL (ref <150)

## 2018-05-24 LAB — COMPLETE METABOLIC PANEL WITH GFR
AG RATIO: 1.7 (calc) (ref 1.0–2.5)
ALBUMIN MSPROF: 4.6 g/dL (ref 3.6–5.1)
ALKALINE PHOSPHATASE (APISO): 86 U/L (ref 40–115)
ALT: 53 U/L — ABNORMAL HIGH (ref 9–46)
AST: 21 U/L (ref 10–40)
BILIRUBIN TOTAL: 0.6 mg/dL (ref 0.2–1.2)
BUN: 14 mg/dL (ref 7–25)
CHLORIDE: 103 mmol/L (ref 98–110)
CO2: 26 mmol/L (ref 20–32)
Calcium: 9.8 mg/dL (ref 8.6–10.3)
Creat: 1.14 mg/dL (ref 0.60–1.35)
GFR, Est African American: 93 mL/min/{1.73_m2} (ref >=60)
GFR, Est Non African American: 81 mL/min/{1.73_m2} (ref >=60)
GLOBULIN: 2.7 g/dL (ref 1.9–3.7)
Glucose, Bld: 97 mg/dL (ref 65–99)
Potassium: 4 mmol/L (ref 3.5–5.3)
SODIUM: 137 mmol/L (ref 135–146)
TOTAL PROTEIN: 7.3 g/dL (ref 6.1–8.1)

## 2018-05-24 MED ORDER — OMEPRAZOLE 40 MG PO CPDR: 40.0000 mg | DELAYED_RELEASE_CAPSULE | Freq: Every day | ORAL | 3 refills | Status: DC

## 2018-05-24 MED ORDER — LISINOPRIL 20 MG PO TABS: 20.0000 mg | ORAL_TABLET | Freq: Every day | ORAL | 1 refills | Status: DC

## 2018-05-24 MED ORDER — AMPHETAMINE-DEXTROAMPHETAMINE 15 MG PO TABS: 15.0000 mg | ORAL_TABLET | Freq: Two times a day (BID) | ORAL | 0 refills | Status: DC

## 2018-05-24 NOTE — Progress Notes (Signed)
Brett Brown is a 38 y.o. male who presents to [name]: Primary Care Sports Medicine today for follow-up hypertension and ADHD.  Brett Brown was seen for his first visit about a month ago on December 4 for hypertension and likely ADHD.  Hypertension: Patient was started on lisinopril 10 mg daily.  He notes he tolerates it well with no issues.  He notes occasional lightheadedness when he stands up from a crouched position but feels fine otherwise.  No chest pain palpitations shortness of breath or significant dizziness or lightheadedness.  Additionally he was started on exteriorize Adderall for ADHD.  He was diagnosed as a child but never was on any medications.  He notes that on extremities Adderall he has significant improvement in ability to focus and complete tasks.  He notes it is significantly improved.  He does note however on Adderall he has been having quite a bit of acid reflux.  He has been taking some Zantac which helps but he wonders if this is a side effect.  He notes that he started the lisinopril and Adderall separated by a few days and notes that the lisinopril did not seem to cause acid reflux.  He also notes a little bit of decreased libido on either the Adderall or lisinopril.  He notes is not really having much erectile dysfunction.  Overall he is quite happy with how things are going.  He notes improved anxiety symptoms.  Patient only is noted during the prior visit he suffered a severe thumb laceration almost amputation on November 13.  This was surgically repaired by Dr. Nolen Mu.  He has a follow-up appointment tomorrow.  He notes the wound has healed significantly however he has a thick black eschar at the tip of his thumb.  He notes the surrounding tissue is not particularly tender and he denies any drainage or discharge.  He is happy that things are going.  Additionally at the  last visit Brett Brown had morbid obesity.  He has been improving his diet and effort to lose weight and has managed to lose a considerable amount of weight.  He is happy with how things are going.    ROS as above:  Exam:  BP 138/86   Pulse 85   Ht 6\' 1"  (1.854 m)   Wt 288 lb (130.6 kg)   BMI 38.00 kg/m  Wt Readings from Last 5 Encounters:  05/24/18 288 lb (130.6 kg)  04/19/18 (!) 304 lb (137.9 kg)  06/01/17 272 lb (123.4 kg)    Gen: Well NAD HEENT: EOMI,  MMM Lungs: Normal work of breathing. CTABL Heart: RRR no MRG Abd: NABS, Soft. Nondistended, Nontender Exts: Brisk capillary refill, warm and well perfused.  Right thumb thick eschar at the tip of the thumb with a deformed appearing nail.  Surrounding skin and eschar is not particularly erythematous and not very tender.  No discharge.  Normal thumb motion.  Scar more proximal in the thumb is mature and well appearing Psych alert and oriented normal speech thought process and affect.  Depression screen [facility] 2/9 05/24/2018 04/19/2018  Decreased Interest 0 0  Down, Depressed, Hopeless 0 0  PHQ - 2 Score 0 0  Altered sleeping 0 0  Tired, decreased energy 0 1  Change in appetite 0 1  Feeling bad or failure about yourself  0 0  Trouble concentrating 1 3  Moving slowly or fidgety/restless 1 0  Suicidal thoughts 0 0  PHQ-9 Score 2  5  Difficult doing work/chores Not difficult at all Not difficult at all   GAD 7 : Generalized Anxiety Score 05/24/2018 04/19/2018  Nervous, Anxious, on Edge 0 0  Control/stop worrying 0 0  Worry too much - different things 0 3  Trouble relaxing 0 3  Restless 0 0  Easily annoyed or irritable 0 0  Afraid - awful might happen 0 0  Total GAD 7 Score 0 6  Anxiety Difficulty Not difficult at all Not difficult at all      Lab and Radiology Results No results found for this or any previous visit (from the past 72 hour(s)). No results found.    Assessment and Plan: 38 y.o. male with  Hypertension: Blood  pressure significantly improved with lisinopril.  However not quite at goal.  Plan to increase lisinopril to 20 mg daily and check metabolic panel along with basic fasting labs.  ADHD: Significant improvement with Adderall extended release.  However patient is having side effects including acid reflux and some mild change in libido.  Plan to switch to immediate release form and recheck in 1 month.  Will use 15 mg twice daily.  Obesity: Significant weight loss since last visit.  This is intentional.  Continue to follow along and recheck in 1 month.  Eschar on thumb.  Patient has had some tissue die off after the surgical repair which is not unexpected.  The surrounding tissue does appear to be viable and not infected.  He is a follow-up appoint with his hand surgeon tomorrow which I think is probably a good idea to keep.  Treat per hand surgery  PDMP reviewed during this encounter. Orders Placed This Encounter  Procedures  . CBC  . COMPLETE METABOLIC PANEL WITH GFR  . Lipid Panel w/reflex Direct LDL   Meds ordered this encounter  Medications  . amphetamine-dextroamphetamine (ADDERALL) 15 MG tablet    Sig: Take 1 tablet by mouth 2 (two) times daily.    Dispense:  60 tablet    Refill:  0  . lisinopril (PRINIVIL,ZESTRIL) 20 MG tablet    Sig: Take 1 tablet (20 mg total) by mouth daily.    Dispense:  30 tablet    Refill:  1  . omeprazole (PRILOSEC) 40 MG capsule    Sig: Take 1 capsule (40 mg total) by mouth daily.    Dispense:  30 capsule    Refill:  3     Historical information moved to improve visibility of documentation.  Past Medical History:  Diagnosis Date  . ADHD   . Hypertension    Past Surgical History:  Procedure Laterality Date  . ROTATOR CUFF REPAIR Right 2018  . 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 Smoking status: Never Smoker  . Smokeless tobacco: Never Used  Substance Use Topics  .  Alcohol use: No    Frequency: Never   family history includes Cancer (age of onset: 8274) in his father; Hypertension in his father.  Medications: Current Outpatient Medications  Medication Sig Dispense Refill  . acetaminophen (TYLENOL) 325 MG tablet Take 2 tablets (650 mg total) by mouth every 6 (six) hours.    Marland Kitchen. ibuprofen (ADVIL) 200 MG tablet Take 3 tablets (600 mg total) by mouth every 6 (six) hours.    Marland Kitchen. lisinopril (PRINIVIL,ZESTRIL) 20 MG tablet Take 1 tablet (20 mg total) by mouth daily. 30 tablet 1  . amphetamine-dextroamphetamine (ADDERALL) 15  MG tablet Take 1 tablet by mouth 2 (two) times daily. 60 tablet 0  . omeprazole (PRILOSEC) 40 MG capsule Take 1 capsule (40 mg total) by mouth daily. 30 capsule 3   No current facility-administered medications for this visit.    No Known Allergies   Discussed warning signs or symptoms. Please see discharge instructions. Patient expresses understanding.

## 2018-05-24 NOTE — Patient Instructions (Addendum)
Thank you for coming in today.  Try switching to immediate release adderall 15.  OK to take 1/2 pills Increase lisinopril to 20mg  daily.  Use omeprazole as needed for acid reflux.   Recheck in about 1 month again.

## 2018-05-25 DIAGNOSIS — S61111D Laceration without foreign body of right thumb with damage to nail, subsequent encounter: Secondary | ICD-10-CM | POA: Diagnosis not present

## 2018-05-25 DIAGNOSIS — S61111A Laceration without foreign body of right thumb with damage to nail, initial encounter: Secondary | ICD-10-CM | POA: Diagnosis not present

## 2018-06-07 ENCOUNTER — Other Ambulatory Visit: Payer: Self-pay | Admitting: Family Medicine

## 2018-06-08 ENCOUNTER — Telehealth: Payer: Self-pay

## 2018-06-08 NOTE — Telephone Encounter (Signed)
Patient called and requested a increased dosage on his Adderall. Patient is currently on 15 mg. Please advise if patient needs an appointment or if you will increase the dosage.Bjorn Loser Cunningham,CMA

## 2018-06-09 MED ORDER — AMPHETAMINE-DEXTROAMPHETAMINE 20 MG PO TABS: 20.0000 mg | ORAL_TABLET | Freq: Two times a day (BID) | ORAL | 0 refills | Status: DC

## 2018-06-09 NOTE — Telephone Encounter (Signed)
Left detailed message on patient vm advising of the dose change. Advised patient to call the office back if he had any further questions. Ival Pacer,CMA

## 2018-06-09 NOTE — Telephone Encounter (Signed)
Adderall dose increased to 20 mg twice daily.

## 2018-06-21 ENCOUNTER — Ambulatory Visit (INDEPENDENT_AMBULATORY_CARE_PROVIDER_SITE_OTHER): Payer: Federal, State, Local not specified - PPO | Admitting: Family Medicine

## 2018-06-21 ENCOUNTER — Encounter: Payer: Self-pay | Admitting: Family Medicine

## 2018-06-21 VITALS — BP 117/72 | HR 82 | Temp 97.7°F | Wt 276.0 lb

## 2018-06-21 DIAGNOSIS — I10 Essential (primary) hypertension: Secondary | ICD-10-CM

## 2018-06-21 DIAGNOSIS — F902 Attention-deficit hyperactivity disorder, combined type: Secondary | ICD-10-CM

## 2018-06-21 DIAGNOSIS — R6882 Decreased libido: Secondary | ICD-10-CM

## 2018-06-21 MED ORDER — LISINOPRIL 10 MG PO TABS: 10.0000 mg | ORAL_TABLET | Freq: Every day | ORAL | 1 refills | Status: DC

## 2018-06-21 MED ORDER — TADALAFIL 20 MG PO TABS: 10.0000 mg | ORAL_TABLET | ORAL | 11 refills | Status: DC | PRN

## 2018-06-21 NOTE — Progress Notes (Signed)
Brett Brown is a 38 y.o. male who presents to [facility]: Primary Care Sports Medicine today for follow-up hypertension, ADHD, obesity, and discuss low libido/erectile dysfunction.  Brett Brown has a history of hypertension.  He previously was started on lisinopril.  At 10 mg of lisinopril his blood pressure was not fully controlled and it was increased to 20 mg the last visit.  He notes that he has been continue to work on lifestyle change and has continued to lose weight.  He started having episodes of lightheadedness and dizziness and low blood pressures.  He is reduced his lisinopril dose back down to 10 mg and notes that he feels a lot better.  No chest pain palpitations lightheadedness or dizziness currently.  ADHD: Brett Brown was recently diagnosed with ADHD.  He has been started on Adderall.  He had trouble with extended release Adderall causing significant acid reflux and dyspepsia.  He has been switched to immediate release and did well but not fully well on 15 mg.  He wanted to switch to 20 mg but cannot get it filled until February 8.  Additionally since starting the Adderall and lisinopril he notes slightly decreased libido and some erectile dysfunction.  He notes this is a mild problem that he like to get fixed if possible.   ROS as above:  Exam:  BP 117/72   Pulse 82   Temp 97.7 F (36.5 C) (Oral)   Wt 276 lb (125.2 kg)   BMI 36.41 kg/m   Wt Readings from Last 5 Encounters:  06/21/18 276 lb (125.2 kg)  05/24/18 288 lb (130.6 kg)  04/19/18 (!) 304 lb (137.9 kg)  06/01/17 272 lb (123.4 kg)    Gen: Well NAD HEENT: EOMI,  MMM Lungs: Normal work of breathing. CTABL Heart: RRR no MRG Abd: NABS, Soft. Nondistended, Nontender Exts: Brisk capillary refill, warm and well perfused.  Psych alert and oriented normal speech thought process and affect.  Lab and Radiology Results   Chemistry       Component Value Date/Time   NA 137 05/24/2018 1131   K 4.0 05/24/2018 1131   CL 103 05/24/2018 1131   CO2 26 05/24/2018 1131   BUN 14 05/24/2018 1131   CREATININE 1.14 05/24/2018 1131      Component Value Date/Time   CALCIUM 9.8 05/24/2018 1131   AST 21 05/24/2018 1131   ALT 53 (H) 05/24/2018 1131   BILITOT 0.6 05/24/2018 1131     Lab Results  Component Value Date   CHOL 165 05/24/2018   HDL 37 (L) 05/24/2018   LDLCALC 107 (H) 05/24/2018   TRIG 116 05/24/2018   CHOLHDL 4.5 05/24/2018       Assessment and Plan: 38 y.o. male with  Hypertension well controlled.  Continue lisinopril 10 mg daily.  Further weight loss may continue to decrease lisinopril dose.  Recent lab check normal creatinine.  Recheck 3 months if all is well.  Continue to work on weight loss.  ADHD: Doing reasonably well.  Continue Adderall.  Switch to 20 mg in the near future.  Recheck 3 months if all is well.  Low libido/erectile dysfunction: Possibly side effect of Adderall.  Plan to have a trial off of Adderall on the weekends to see if it improves a bit.  Will check testosterone and TSH.  Trial of Cialis.  Recheck in 3 months if all is well..  Obesity: Significant improvement.  Continue further weight loss.  PDMP  reviewed during this encounter. Orders Placed This Encounter  Procedures  . Testosterone  . TSH   Meds ordered this encounter  Medications  . lisinopril (PRINIVIL,ZESTRIL) 10 MG tablet    Sig: Take 1 tablet (10 mg total) by mouth daily.    Dispense:  90 tablet    Refill:  1  . tadalafil (ADCIRCA/CIALIS) 20 MG tablet    Sig: Take 0.5-1 tablets (10-20 mg total) by mouth every other day as needed for erectile dysfunction.    Dispense:  30 tablet    Refill:  11     Historical information moved to improve visibility of documentation.  Past Medical History:  Diagnosis Date  . ADHD   . Hypertension    Past Surgical History:  Procedure Laterality Date  . ROTATOR CUFF REPAIR  Right 2018  . 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 Smoking status: Never Smoker  . Smokeless tobacco: Never Used  Substance Use Topics  . Alcohol use: No    Frequency: Never   family history includes Cancer (age of onset: 574) in his father; Hypertension in his father.  Medications: Current Outpatient Medications  Medication Sig Dispense Refill  . acetaminophen (TYLENOL) 325 MG tablet Take 2 tablets (650 mg total) by mouth every 6 (six) hours.    Marland Kitchen. amphetamine-dextroamphetamine (ADDERALL) 20 MG tablet Take 1 tablet (20 mg total) by mouth 2 (two) times daily. 60 tablet 0  . ibuprofen (ADVIL) 200 MG tablet Take 3 tablets (600 mg total) by mouth every 6 (six) hours.    Marland Kitchen. lisinopril (PRINIVIL,ZESTRIL) 10 MG tablet Take 1 tablet (10 mg total) by mouth daily. 90 tablet 1  . omeprazole (PRILOSEC) 40 MG capsule Take 1 capsule (40 mg total) by mouth daily. 30 capsule 3  . tadalafil (ADCIRCA/CIALIS) 20 MG tablet Take 0.5-1 tablets (10-20 mg total) by mouth every other day as needed for erectile dysfunction. 30 tablet 11   No current facility-administered medications for this visit.    No Known Allergies   Discussed warning signs or symptoms. Please see discharge instructions. Patient expresses understanding.

## 2018-06-21 NOTE — Patient Instructions (Addendum)
Thank you for coming in today.  Switch back to 10mg  lisinopril.  Ok to cut to 5mg  if light headed or dizzy and blood pressure is low.  Consider taking it at bedtime.   Recheck in 3 months.  Return sooner if needed.    Request refills of adderall when about 1 week left.   Will try Cialis   Get labs in the near future fasting in the morning.

## 2018-07-06 DIAGNOSIS — S61111D Laceration without foreign body of right thumb with damage to nail, subsequent encounter: Secondary | ICD-10-CM | POA: Diagnosis not present

## 2018-07-06 DIAGNOSIS — S62521D Displaced fracture of distal phalanx of right thumb, subsequent encounter for fracture with routine healing: Secondary | ICD-10-CM | POA: Diagnosis not present

## 2018-07-18 ENCOUNTER — Other Ambulatory Visit: Payer: Self-pay | Admitting: Family Medicine

## 2018-07-18 DIAGNOSIS — S61111D Laceration without foreign body of right thumb with damage to nail, subsequent encounter: Secondary | ICD-10-CM | POA: Diagnosis not present

## 2018-07-25 ENCOUNTER — Other Ambulatory Visit: Payer: Self-pay

## 2018-07-25 MED ORDER — AMPHETAMINE-DEXTROAMPHETAMINE 20 MG PO TABS: 20.0000 mg | ORAL_TABLET | Freq: Two times a day (BID) | ORAL | 0 refills | Status: DC

## 2018-07-25 NOTE — Telephone Encounter (Signed)
Patient requests refill on Adderall.

## 2018-08-25 ENCOUNTER — Other Ambulatory Visit: Payer: Self-pay | Admitting: Family Medicine

## 2018-08-28 ENCOUNTER — Other Ambulatory Visit: Payer: Self-pay | Admitting: Family Medicine

## 2018-08-28 MED ORDER — AMPHETAMINE-DEXTROAMPHETAMINE 20 MG PO TABS: 20.0000 mg | ORAL_TABLET | Freq: Two times a day (BID) | ORAL | 0 refills | Status: DC

## 2018-09-19 ENCOUNTER — Encounter: Payer: Self-pay | Admitting: Family Medicine

## 2018-09-19 ENCOUNTER — Telehealth (INDEPENDENT_AMBULATORY_CARE_PROVIDER_SITE_OTHER): Payer: Federal, State, Local not specified - PPO | Admitting: Family Medicine

## 2018-09-19 VITALS — Temp 97.9°F | Ht 73.0 in | Wt 260.0 lb

## 2018-09-19 DIAGNOSIS — F902 Attention-deficit hyperactivity disorder, combined type: Secondary | ICD-10-CM | POA: Diagnosis not present

## 2018-09-19 DIAGNOSIS — I10 Essential (primary) hypertension: Secondary | ICD-10-CM

## 2018-09-19 DIAGNOSIS — E6609 Other obesity due to excess calories: Secondary | ICD-10-CM

## 2018-09-19 DIAGNOSIS — Z6834 Body mass index (BMI) 34.0-34.9, adult: Secondary | ICD-10-CM | POA: Diagnosis not present

## 2018-09-19 MED ORDER — AMPHETAMINE-DEXTROAMPHETAMINE 20 MG PO TABS: 20.0000 mg | ORAL_TABLET | Freq: Two times a day (BID) | ORAL | 0 refills | Status: DC

## 2018-09-19 NOTE — Patient Instructions (Signed)
Thank you for coming in today.  For blood pressure please get an automatic home blood pressure cuff so you can keep track of your blood pressure.  Measure a log and send the results to me in the near future and we can determine if you even need to be on lisinopril at all.  Okay to continue Adderall.  If you are having trouble sleeping consider try taking a half a pill in the afternoon dose and see if that works for you.  Recheck with me in about 3 months.  Continue weight loss.  Continue to be careful about her diet and make sure you get some exercise.  Here for you if anything changes or you have any questions or concerns.

## 2018-09-19 NOTE — Progress Notes (Signed)
Virtual Visit  via Video Note  I connected with      Brett Brown by a video enabled telemedicine application and verified that I am speaking with the correct person using two identifiers.   I discussed the limitations of evaluation and management by telemedicine and the availability of in person appointments. The patient expressed understanding and agreed to proceed.  History of Present Illness: Brett Brown is a 38 y.o. male who would like to discuss hypertension, and ADHD.  ADHD: Currently taking 20 mg of Adderall twice daily.  He notes that the Adderall helps his focus and concentration.  He notes that sometimes he has difficulty sleeping especially if he takes the afternoon dose of it late.  He is not tried to reduce the dose.   Hypertension: Patient was found to have hypertension was started on lisinopril.  He also has been working on losing weight.  He notes that he is managed to lose weight significantly.  His max weight was 304 pounds in December and he recently measured his weight at 260 pounds.  He is not taking lisinopril regularly.  He takes it intermittently when his blood pressure feels high.  He does not have a home blood pressure cuff and has not been checking his blood pressure however.  No chest pain palpitation lightheadedness or dizziness.    Observations/Objective: Temp 97.9 F (36.6 C) (Oral)   Ht 6\' 1"  (1.854 m)   Wt 260 lb (117.9 kg)   BMI 34.30 kg/m  Wt Readings from Last 5 Encounters:  09/19/18 260 lb (117.9 kg)  06/21/18 276 lb (125.2 kg)  05/24/18 288 lb (130.6 kg)  04/19/18 (!) 304 lb (137.9 kg)  06/01/17 272 lb (123.4 kg)   Exam: Appearance nontoxic no acute distress Normal Speech.  Psych alert and oriented normal speech thought process and affect.  Lab and Radiology Results No results found for this or any previous visit (from the past 72 hour(s)). No results found.   Assessment and Plan: 38 y.o. male with . ADHD: Doing  well.  Patient having some insomnia likely due to Adderall if taken late.  Consider taking smaller doses of Adderall in the afternoon 10 mg and see if it makes any difference.  Recheck in 3 months.  Hypertension: Blood pressure hard to tell however it has been well controlled and he is continuing to lose weight.  His blood pressure is probably pretty well controlled off of lisinopril.  Recommend getting home blood pressure log so that he actually knows what his blood pressure is.  I do not recommend taking lisinopril intermittently.  Likely will discontinue lisinopril in future  Obesity: Patient has continued to lose weight.  He is no longer morbidly obese.  This is fantastic news.  PDMP reviewed during this encounter. No orders of the defined types were placed in this encounter.  Meds ordered this encounter  Medications  . amphetamine-dextroamphetamine (ADDERALL) 20 MG tablet    Sig: Take 1 tablet (20 mg total) by mouth 2 (two) times daily.    Dispense:  180 tablet    Refill:  0    Follow Up Instructions:    I discussed the assessment and treatment plan with the patient. The patient was provided an opportunity to ask questions and all were answered. The patient agreed with the plan and demonstrated an understanding of the instructions.   The patient was advised to call back or seek an in-person evaluation if the symptoms worsen or  if the condition fails to improve as anticipated.  Time: 15 minutes of intraservice time, with >22 minutes of total time during today's visit.      Historical information moved to improve visibility of documentation.  Past Medical History:  Diagnosis Date  . ADHD   . Hypertension    Past Surgical History:  Procedure Laterality Date  . ROTATOR CUFF REPAIR Right 2018  . 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 Smoking status: Never Smoker  . Smokeless tobacco: Never Used   Substance Use Topics  . Alcohol use: No    Frequency: Never   family history includes Cancer (age of onset: 2974) in his father; Hypertension in his father.  Medications: Current Outpatient Medications  Medication Sig Dispense Refill  . acetaminophen (TYLENOL) 325 MG tablet Take 2 tablets (650 mg total) by mouth every 6 (six) hours.    Marland Kitchen. amphetamine-dextroamphetamine (ADDERALL) 20 MG tablet Take 1 tablet (20 mg total) by mouth 2 (two) times daily. 180 tablet 0  . ibuprofen (ADVIL) 200 MG tablet Take 3 tablets (600 mg total) by mouth every 6 (six) hours.    Marland Kitchen. lisinopril (PRINIVIL,ZESTRIL) 10 MG tablet Take 1 tablet (10 mg total) by mouth daily. 90 tablet 1  . omeprazole (PRILOSEC) 40 MG capsule Take 1 capsule (40 mg total) by mouth daily. 30 capsule 3  . tadalafil (ADCIRCA/CIALIS) 20 MG tablet Take 0.5-1 tablets (10-20 mg total) by mouth every other day as needed for erectile dysfunction. 30 tablet 11   No current facility-administered medications for this visit.    No Known Allergies

## 2018-10-05 ENCOUNTER — Encounter: Payer: Self-pay | Admitting: Family Medicine

## 2018-10-05 ENCOUNTER — Ambulatory Visit: Payer: Federal, State, Local not specified - PPO | Admitting: Family Medicine

## 2018-10-05 ENCOUNTER — Ambulatory Visit (INDEPENDENT_AMBULATORY_CARE_PROVIDER_SITE_OTHER): Payer: Federal, State, Local not specified - PPO | Admitting: Family Medicine

## 2018-10-05 VITALS — Temp 97.9°F | Ht 73.0 in | Wt 260.0 lb

## 2018-10-05 DIAGNOSIS — F411 Generalized anxiety disorder: Secondary | ICD-10-CM | POA: Insufficient documentation

## 2018-10-05 DIAGNOSIS — F902 Attention-deficit hyperactivity disorder, combined type: Secondary | ICD-10-CM

## 2018-10-05 DIAGNOSIS — Z7289 Other problems related to lifestyle: Secondary | ICD-10-CM | POA: Diagnosis not present

## 2018-10-05 DIAGNOSIS — Z789 Other specified health status: Secondary | ICD-10-CM

## 2018-10-05 MED ORDER — FLUOXETINE HCL 10 MG PO CAPS: ORAL_CAPSULE | ORAL | 0 refills | Status: DC

## 2018-10-05 NOTE — Progress Notes (Signed)
Erroneous encounter-disregard

## 2018-10-05 NOTE — Progress Notes (Signed)
GAD/PHQ completed

## 2018-10-05 NOTE — Progress Notes (Signed)
Virtual Visit  via Video Note  I connected with      Brett Brown by a video enabled telemedicine application and verified that I am speaking with the correct person using two identifiers.   I discussed the limitations of evaluation and management by telemedicine and the availability of in person appointments. The patient expressed understanding and agreed to proceed.  History of Present Illness: Brett Brown is a 38 y.o. male who would like to discuss anxiety and alcohol.  Thi has a history of anxiety.  He had anxiety for most of his life but notes that it is worsened recently.  He notes that he has difficulty in crowds and does not like to leave his house.  He notes increased stressors as well and overall is feeling worse.  He is never been treated for anxiety previously.  He notes that he is increased his alcohol intake.  He thinks he has been drinking more to help manage his anxiety symptoms.  He recognizes that it is not healthy and lots of help to control his anxiety.  He is drinking about 6 shots of alcohol every other day.  He is never had problems with alcohol withdrawal in the past such as tremors or delirium tremens.  He has an existing history of ADHD that is reasonably well-controlled with Adderall.  He is happy with how that is going.   Observations/Objective: Temp 97.9 F (36.6 C) (Oral)   Ht 6\' 1"  (1.854 m)   Wt 260 lb (117.9 kg)   BMI 34.30 kg/m  Wt Readings from Last 5 Encounters:  10/05/18 260 lb (117.9 kg)  09/19/18 260 lb (117.9 kg)  06/21/18 276 lb (125.2 kg)  05/24/18 288 lb (130.6 kg)  04/19/18 (!) 304 lb (137.9 kg)   Exam: Appearance nontoxic no acute distress Normal Speech.  Psych alert and oriented normal speech thought process and affect. Depression screen [facility] 2/9 10/05/2018 05/24/2018 04/19/2018  Decreased Interest 1 0 0  Down, Depressed, Hopeless 1 0 0  PHQ - 2 Score 2 0 0  Altered sleeping 3 0 0  Tired, decreased energy 0 0 1   Change in appetite 0 0 1  Feeling bad or failure about yourself  0 0 0  Trouble concentrating 1 1 3   Moving slowly or fidgety/restless 0 1 0  Suicidal thoughts 0 0 0  PHQ-9 Score 6 2 5   Difficult doing work/chores Somewhat difficult Not difficult at all Not difficult at all   GAD 7 : Generalized Anxiety Score 10/05/2018 05/24/2018 04/19/2018  Nervous, Anxious, on Edge 1 0 0  Control/stop worrying 1 0 0  Worry too much - different things 1 0 3  Trouble relaxing 1 0 3  Restless 0 0 0  Easily annoyed or irritable 0 0 0  Afraid - awful might happen 3 0 0  Total GAD 7 Score 7 0 6  Anxiety Difficulty Somewhat difficult Not difficult at all Not difficult at all      Lab and Radiology Results No results found for this or any previous visit (from the past 72 hour(s)). No results found.   Assessment and Plan: 38 y.o. male with generalized anxiety disorder  Worsening recently.  After discussion plan to start Prozac and referral to behavioral health for counseling.  Plan to recheck in about 2 weeks.  Alcohol: Increased alcohol intake.  Likely due to anxiety.  Advised cessation of alcohol until feeling better.  Recheck in 2 weeks.  Follow Up Instructions:    I discussed the assessment and treatment plan with the patient. The patient was provided an opportunity to ask questions and all were answered. The patient agreed with the plan and demonstrated an understanding of the instructions.   The patient was advised to call back or seek an in-person evaluation if the symptoms worsen or if the condition fails to improve as anticipated.  Time: 25 minutes of intraservice time, with >39 minutes of total time during today's visit.      Historical information moved to improve visibility of documentation.  Past Medical History:  Diagnosis Date  . ADHD   . Hypertension    Past Surgical History:  Procedure Laterality Date  . ROTATOR CUFF REPAIR Right 2018  . thumb surg near amputation  retached Right 03/29/2018   [facility]   Social History   Tobacco Use  . Smoking status: Never Smoker  . Smokeless tobacco: Never Used  Substance Use Topics  . Alcohol use: No    Frequency: Never   family history includes Cancer (age of onset: 1874) in his father; Hypertension in his father.  Medications: Current Outpatient Medications  Medication Sig Dispense Refill  . acetaminophen (TYLENOL) 325 MG tablet Take 2 tablets (650 mg total) by mouth every 6 (six) hours.    Marland Kitchen. amphetamine-dextroamphetamine (ADDERALL) 20 MG tablet Take 1 tablet (20 mg total) by mouth 2 (two) times daily. 180 tablet 0  . ibuprofen (ADVIL) 200 MG tablet Take 3 tablets (600 mg total) by mouth every 6 (six) hours.    Marland Kitchen. lisinopril (PRINIVIL,ZESTRIL) 10 MG tablet Take 1 tablet (10 mg total) by mouth daily. 90 tablet 1  . omeprazole (PRILOSEC) 40 MG capsule Take 1 capsule (40 mg total) by mouth daily. 30 capsule 3  . tadalafil (ADCIRCA/CIALIS) 20 MG tablet Take 0.5-1 tablets (10-20 mg total) by mouth every other day as needed for erectile dysfunction. 30 tablet 11  . FLUoxetine (PROZAC) 10 MG capsule Take 1 capsule (10 mg total) by mouth daily for 7 days, THEN 2 capsules (20 mg total) daily for 21 days. 49 capsule 0   No current facility-administered medications for this visit.    No Known Allergies

## 2018-10-05 NOTE — Patient Instructions (Signed)
Thank you for coming in today.   Start Prozac 10 mg daily and increase to 20 mg daily (2 pills) in about 1 week. You should hear from behavioral health about therapy. Decrease or limit alcohol intake. Recheck in 2 weeks as arranged.  Fluoxetine capsules or tablets (Depression/Mood Disorders) What is this medicine? FLUOXETINE (floo OX e teen) belongs to a class of drugs known as selective serotonin reuptake inhibitors (SSRIs). It helps to treat mood problems such as depression, obsessive compulsive disorder, and panic attacks. It can also treat certain eating disorders. This medicine may be used for other purposes; ask your health care provider or pharmacist if you have questions. COMMON BRAND NAME(S): Prozac What should I tell my health care provider before I take this medicine? They need to know if you have any of these conditions: -bipolar disorder or a family history of bipolar disorder -bleeding disorders -glaucoma -heart disease -liver disease -low levels of sodium in the blood -seizures -suicidal thoughts, plans, or attempt; a previous suicide attempt by you or a family member -take MAOIs like Carbex, Eldepryl, Marplan, Nardil, and Parnate -take medicines that treat or prevent blood clots -thyroid disease -an unusual or allergic reaction to fluoxetine, other medicines, foods, dyes, or preservatives -pregnant or trying to get pregnant -breast-feeding How should I use this medicine? Take this medicine by mouth with a glass of water. Follow the directions on the prescription label. You can take this medicine with or without food. Take your medicine at regular intervals. Do not take it more often than directed. Do not stop taking this medicine suddenly except upon the advice of your doctor. Stopping this medicine too quickly may cause serious side effects or your condition may worsen. A special MedGuide will be given to you by the pharmacist with each prescription and refill. Be sure  to read this information carefully each time. Talk to your pediatrician regarding the use of this medicine in children. While this drug may be prescribed for children as young as 7 years for selected conditions, precautions do apply. Overdosage: If you think you have taken too much of this medicine contact a poison control center or emergency room at once. NOTE: This medicine is only for you. Do not share this medicine with others. What if I miss a dose? If you miss a dose, skip the missed dose and go back to your regular dosing schedule. Do not take double or extra doses. What may interact with this medicine? Do not take this medicine with any of the following medications: -other medicines containing fluoxetine, like Sarafem or Symbyax -cisapride -dronedarone -linezolid -MAOIs like Carbex, Eldepryl, Marplan, Nardil, and Parnate -methylene blue (injected into a vein) -pimozide -thioridazine This medicine may also interact with the following medications: -alcohol -amphetamines -aspirin and aspirin-like medicines -carbamazepine -certain medicines for depression, anxiety, or psychotic disturbances -certain medicines for migraine headaches like almotriptan, eletriptan, frovatriptan, naratriptan, rizatriptan, sumatriptan, zolmitriptan -digoxin -diuretics -fentanyl -flecainide -furazolidone -isoniazid -lithium -medicines for sleep -medicines that treat or prevent blood clots like warfarin, enoxaparin, and dalteparin -NSAIDs, medicines for pain and inflammation, like ibuprofen or naproxen -other medicines that prolong the QT interval (an abnormal heart rhythm) -phenytoin -procarbazine -propafenone -rasagiline -ritonavir -supplements like St. John's wort, kava kava, valerian -tramadol -tryptophan -vinblastine This list may not describe all possible interactions. Give your health care provider a list of all the medicines, herbs, non-prescription drugs, or dietary supplements you  use. Also tell them if you smoke, drink alcohol, or use illegal drugs. Some items  may interact with your medicine. What should I watch for while using this medicine? Tell your doctor if your symptoms do not get better or if they get worse. Visit your doctor or health care professional for regular checks on your progress. Because it may take several weeks to see the full effects of this medicine, it is important to continue your treatment as prescribed by your doctor. Patients and their families should watch out for new or worsening thoughts of suicide or depression. Also watch out for sudden changes in feelings such as feeling anxious, agitated, panicky, irritable, hostile, aggressive, impulsive, severely restless, overly excited and hyperactive, or not being able to sleep. If this happens, especially at the beginning of treatment or after a change in dose, call your health care professional. Bonita Quin may get drowsy or dizzy. Do not drive, use machinery, or do anything that needs mental alertness until you know how this medicine affects you. Do not stand or sit up quickly, especially if you are an older patient. This reduces the risk of dizzy or fainting spells. Alcohol may interfere with the effect of this medicine. Avoid alcoholic drinks. Your mouth may get dry. Chewing sugarless gum or sucking hard candy, and drinking plenty of water may help. Contact your doctor if the problem does not go away or is severe. This medicine may affect blood sugar levels. If you have diabetes, check with your doctor or health care professional before you change your diet or the dose of your diabetic medicine. What side effects may I notice from receiving this medicine? Side effects that you should report to your doctor or health care professional as soon as possible: -allergic reactions like skin rash, itching or hives, swelling of the face, lips, or tongue -anxious -black, tarry stools -breathing problems -changes in  vision -confusion -elevated mood, decreased need for sleep, racing thoughts, impulsive behavior -eye pain -fast, irregular heartbeat -feeling faint or lightheaded, falls -feeling agitated, angry, or irritable -hallucination, loss of contact with reality -loss of balance or coordination -loss of memory -painful or prolonged erections -restlessness, pacing, inability to keep still -seizures -stiff muscles -suicidal thoughts or other mood changes -trouble sleeping -unusual bleeding or bruising -unusually weak or tired -vomiting Side effects that usually do not require medical attention (report to your doctor or health care professional if they continue or are bothersome): -change in appetite or weight -change in sex drive or performance -diarrhea -dry mouth -headache -increased sweating -nausea -tremors This list may not describe all possible side effects. Call your doctor for medical advice about side effects. You may report side effects to FDA at 1-800-FDA-1088. Where should I keep my medicine? Keep out of the reach of children. Store at room temperature between 15 and 30 degrees C (59 and 86 degrees F). Throw away any unused medicine after the expiration date. NOTE: This sheet is a summary. It may not cover all possible information. If you have questions about this medicine, talk to your doctor, pharmacist, or health care provider.  2019 Elsevier/Gold Standard (2017-12-22 11:56:53)

## 2018-10-19 ENCOUNTER — Telehealth (INDEPENDENT_AMBULATORY_CARE_PROVIDER_SITE_OTHER): Payer: Federal, State, Local not specified - PPO | Admitting: Family Medicine

## 2018-10-19 DIAGNOSIS — F411 Generalized anxiety disorder: Secondary | ICD-10-CM | POA: Diagnosis not present

## 2018-10-19 MED ORDER — FLUOXETINE HCL 20 MG PO CAPS: 20.0000 mg | ORAL_CAPSULE | Freq: Every day | ORAL | 0 refills | Status: DC

## 2018-10-19 NOTE — Progress Notes (Signed)
Virtual Visit  via Video Note  I connected with      Brett Slaughteravid L Kataoka by a video enabled telemedicine application and verified that I am speaking with the correct person using two identifiers.   I discussed the limitations of evaluation and management by telemedicine and the availability of in person appointments. The patient expressed understanding and agreed to proceed.  History of Present Illness: Brett Brown is a 38 y.o. male who would like to discuss follow-up mood and alcohol.  Patient was seen on May 21 for anxiety.  He notes significant anxiety especially with crowds.  Also he notes that he had been increasing his alcohol intake until his drinking about 6 shots of alcohol every day or every other day.  After discussion we plan to start Prozac with titration and behavioral health counseling.  Additionally recommended alcohol cessation.  In the interim he notes that Things are going well.  His anxiety is less and he is able to quit drinking.  He is happy with how things are going.  He tolerates the Prozac 20 mg pretty well.  He notes occasional swimmy headedness with it but overall is pretty happy with how things are going.   Observations/Objective:  Appearance nontoxic no acute distress Normal Speech.  Normal thought process speech affect.  No SI or HI.   [facility]/2018 10/05/2018 05/24/2018 04/19/2018  Decreased Interest 1 1 0 0  Down, Depressed, Hopeless 0 1 0 0  PHQ - 2 Score 1 2 0 0  Altered sleeping 1 3 0 0  Tired, decreased energy 1 0 0 1  Change in appetite 0 0 0 1  Feeling bad or failure about yourself  0 0 0 0  Trouble concentrating 2 1 1 3   Moving slowly or fidgety/restless 1 0 1 0  Suicidal thoughts 0 0 0 0  PHQ-9 Score 6 6 2 5   Difficult doing work/chores Not difficult at all Somewhat difficult Not difficult at all Not difficult at all   GAD 7 : Generalized Anxiety Score 10/19/2018 10/05/2018 05/24/2018 04/19/2018  Nervous, Anxious, on  Edge 2 1 0 0  Control/stop worrying 2 1 0 0  Worry too much - different things 1 1 0 3  Trouble relaxing 1 1 0 3  Restless 0 0 0 0  Easily annoyed or irritable 0 0 0 0  Afraid - awful might happen 1 3 0 0  Total GAD 7 Score 7 7 0 6  Anxiety Difficulty Not difficult at all Somewhat difficult Not difficult at all Not difficult at all     Lab and Radiology Results No results found for this or any previous visit (from the past 72 hour(s)). No results found.   Assessment and Plan: 38 y.o. male with  Anxiety: Improvement with Prozac.  Plan to continue Prozac 20 mg and recheck in 1 to 2 months.  Return sooner if needed.  Precautions reviewed.  Alcohol: Significant reduction.  This will likely help as well.  Watchful waiting.  PDMP not reviewed this encounter. No orders of the defined types were placed in this encounter.  Meds ordered this encounter  Medications  . FLUoxetine (PROZAC) 20 MG capsule    Sig: Take 1 capsule (20 mg total) by mouth daily.    Dispense:  90 capsule    Refill:  0    Follow Up Instructions:    I discussed the assessment and treatment plan with the patient. The patient was  provided an opportunity to ask questions and all were answered. The patient agreed with the plan and demonstrated an understanding of the instructions.   The patient was advised to call back or seek an in-person evaluation if the symptoms worsen or if the condition fails to improve as anticipated.  Time: 15 minutes of intraservice time, with >22 minutes of total time during today's visit.      Historical information moved to improve visibility of documentation.  Past Medical History:  Diagnosis Date  . ADHD   . Hypertension    Past Surgical History:  Procedure Laterality Date  . ROTATOR CUFF REPAIR Right 2018  . thumb surg near amputation retached Right 03/29/2018   [facility]   Social History   Tobacco Use  . Smoking status: Never Smoker  . Smokeless  tobacco: Never Used  Substance Use Topics  . Alcohol use: No    Frequency: Never   family history includes Cancer (age of onset: 52) in his father; Hypertension in his father.  Medications: Current Outpatient Medications  Medication Sig Dispense Refill  . acetaminophen (TYLENOL) 325 MG tablet Take 2 tablets (650 mg total) by mouth every 6 (six) hours.    Marland Kitchen amphetamine-dextroamphetamine (ADDERALL) 20 MG tablet Take 1 tablet (20 mg total) by mouth 2 (two) times daily. 180 tablet 0  . FLUoxetine (PROZAC) 20 MG capsule Take 1 capsule (20 mg total) by mouth daily. 90 capsule 0  . ibuprofen (ADVIL) 200 MG tablet Take 3 tablets (600 mg total) by mouth every 6 (six) hours.    Marland Kitchen lisinopril (PRINIVIL,ZESTRIL) 10 MG tablet Take 1 tablet (10 mg total) by mouth daily. 90 tablet 1  . omeprazole (PRILOSEC) 40 MG capsule Take 1 capsule (40 mg total) by mouth daily. 30 capsule 3  . tadalafil (ADCIRCA/CIALIS) 20 MG tablet Take 0.5-1 tablets (10-20 mg total) by mouth every other day as needed for erectile dysfunction. 30 tablet 11   No current facility-administered medications for this visit.    No Known Allergies

## 2018-10-23 ENCOUNTER — Ambulatory Visit (INDEPENDENT_AMBULATORY_CARE_PROVIDER_SITE_OTHER): Payer: Federal, State, Local not specified - PPO | Admitting: Psychology

## 2018-10-23 DIAGNOSIS — F1020 Alcohol dependence, uncomplicated: Secondary | ICD-10-CM | POA: Diagnosis not present

## 2018-10-30 ENCOUNTER — Ambulatory Visit (INDEPENDENT_AMBULATORY_CARE_PROVIDER_SITE_OTHER): Payer: Federal, State, Local not specified - PPO | Admitting: Psychology

## 2018-10-30 DIAGNOSIS — F1020 Alcohol dependence, uncomplicated: Secondary | ICD-10-CM | POA: Diagnosis not present

## 2018-11-13 ENCOUNTER — Ambulatory Visit (INDEPENDENT_AMBULATORY_CARE_PROVIDER_SITE_OTHER): Payer: Federal, State, Local not specified - PPO | Admitting: Psychology

## 2018-11-13 DIAGNOSIS — F1020 Alcohol dependence, uncomplicated: Secondary | ICD-10-CM

## 2018-11-21 ENCOUNTER — Encounter: Payer: Self-pay | Admitting: Family Medicine

## 2018-11-22 ENCOUNTER — Ambulatory Visit (INDEPENDENT_AMBULATORY_CARE_PROVIDER_SITE_OTHER): Payer: Federal, State, Local not specified - PPO | Admitting: Family Medicine

## 2018-11-22 ENCOUNTER — Ambulatory Visit (INDEPENDENT_AMBULATORY_CARE_PROVIDER_SITE_OTHER): Payer: Federal, State, Local not specified - PPO

## 2018-11-22 ENCOUNTER — Encounter: Payer: Self-pay | Admitting: Family Medicine

## 2018-11-22 ENCOUNTER — Other Ambulatory Visit: Payer: Self-pay

## 2018-11-22 VITALS — BP 134/88 | HR 83 | Temp 98.2°F | Ht 73.0 in | Wt 250.0 lb

## 2018-11-22 DIAGNOSIS — M4184 Other forms of scoliosis, thoracic region: Secondary | ICD-10-CM | POA: Diagnosis not present

## 2018-11-22 DIAGNOSIS — M545 Low back pain, unspecified: Secondary | ICD-10-CM

## 2018-11-22 DIAGNOSIS — M47816 Spondylosis without myelopathy or radiculopathy, lumbar region: Secondary | ICD-10-CM | POA: Diagnosis not present

## 2018-11-22 DIAGNOSIS — M546 Pain in thoracic spine: Secondary | ICD-10-CM | POA: Diagnosis not present

## 2018-11-22 MED ORDER — CYCLOBENZAPRINE HCL 10 MG PO TABS: 10.0000 mg | ORAL_TABLET | Freq: Three times a day (TID) | ORAL | 1 refills | Status: DC | PRN

## 2018-11-22 NOTE — Patient Instructions (Addendum)
Thank you for coming in today. Attend PT.  Ok to take tylenol and ibuprofen as needed.  OK to use heat and TENS unit.  Use muscle relaxer as needed. It may make you sleepy.  Keep me updated. We can check back with this at the next ADHD visit.   Let me know if I need to do a form for job.    Lumbosacral Strain Lumbosacral strain is an injury that causes pain in the lower back (lumbosacral spine). This injury usually occurs from overstretching the muscles or ligaments along your spine. A strain can affect one or more muscles or cord-like tissues that connect bones to other bones (ligaments). What are the causes? This condition may be caused by:  A hard, direct hit (blow) to the back.  Excessive stretching of the lower back muscles. This may result from: ? A fall. ? Lifting something heavy. ? Repetitive movements such as bending or crouching. What increases the risk? The following factors may increase your risk of getting this condition:  Participating in sports or activities that involve: ? A sudden twist of the back. ? Pushing or pulling motions.  Being overweight or obese.  Having poor strength and flexibility, especially tight hamstrings or weak muscles in the back or abdomen.  Having too much of a curve in the lower back.  Having a pelvis that is tilted forward. What are the signs or symptoms? The main symptom of this condition is pain in the lower back, at the site of the strain. Pain may extend (radiate) down one or both legs. How is this diagnosed? This condition is diagnosed based on:  Your symptoms.  Your medical history.  A physical exam. ? Your health care provider may push on certain areas of your back to determine the source of your pain. ? You may be asked to bend forward, backward, and side to side to assess the severity of your pain and your range of motion.  Imaging tests, such as: ? X-rays. ? MRI.  How is this treated? Treatment for this condition  may include:  Putting heat and cold on the affected area.  Medicines to help relieve pain and relax your muscles (muscle relaxants).  NSAIDs to help reduce swelling and discomfort. When your symptoms improve, it is important to gradually return to your normal routine as soon as possible to reduce pain, avoid stiffness, and avoid loss of muscle strength. Generally, symptoms should improve within 6 weeks of treatment. However, recovery time varies. Follow these instructions at home: Managing pain, stiffness, and swelling   If directed, put ice on the injured area during the first 24 hours after your strain. ? Put ice in a plastic bag. ? Place a towel between your skin and the bag. ? Leave the ice on for 20 minutes, 2-3 times a day.  If directed, put heat on the affected area as often as told by your health care provider. Use the heat source that your health care provider recommends, such as a moist heat pack or a heating pad. ? Place a towel between your skin and the heat source. ? Leave the heat on for 20-30 minutes. ? Remove the heat if your skin turns bright red. This is especially important if you are unable to feel pain, heat, or cold. You may have a greater risk of getting burned. Activity  Rest and return to your normal activities as told by your health care provider. Ask your health care provider what activities are safe  for you.  Avoid activities that take a lot of energy for as long as told by your health care provider. General instructions  Take over-the-counter and prescription medicines only as told by your health care provider.  Donot drive or use heavy machinery while taking prescription pain medicine.  Do not use any products that contain nicotine or tobacco, such as cigarettes and e-cigarettes. If you need help quitting, ask your health care provider.  Keep all follow-up visits as told by your health care provider. This is important. How is this prevented?  Use  correct form when playing sports and lifting heavy objects.  Use good posture when sitting and standing.  Maintain a healthy weight.  Sleep on a mattress with medium firmness to support your back.  Be safe and responsible while being active to avoid falls.  Do at least 150 minutes of moderate-intensity exercise each week, such as brisk walking or water aerobics. Try a form of exercise that takes stress off your back, such as swimming or stationary cycling.  Maintain physical fitness, including: ? Strength. ? Flexibility. ? Cardiovascular fitness. ? Endurance. Contact a health care provider if:  Your back pain does not improve after 6 weeks of treatment.  Your symptoms get worse. Get help right away if:  Your back pain is severe.  You cannot stand or walk.  You have difficulty controlling when you urinate or when you have a bowel movement.  You feel nauseous or you vomit.  Your feet get very cold.  You have numbness, tingling, weakness, or problems using your arms or legs.  You develop any of the following: ? Shortness of breath. ? Dizziness. ? Pain in your legs. ? Weakness in your buttocks or legs. ? Discoloration of the skin on your toes or legs. This information is not intended to replace advice given to you by your health care provider. Make sure you discuss any questions you have with your health care provider. Document Released: 02/10/2005 Document Revised: 08/25/2018 Document Reviewed: 10/05/2015 Elsevier Patient Education  2020 [employer]

## 2018-11-22 NOTE — Progress Notes (Signed)
Brett Brown is a 38 y.o. male who presents to [facility] today for back pain last fee weeks.   [place] has an ongoing history of intermittent mid thoracic or lumbar back pain.  He has had this off and on over the last few years.  He notes the last few weeks is been worse.  He has been increasing his activity level.  He has been doing a lot of yard work and exercising.  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  He thinks his back pain worsened as result of that.  He notes pain mostly in the high lumbar or low thoracic back area.  Pain is worse with activity and better with rest.  He is tried heat which does help as well as ibuprofen.  He has had some evaluation for this in the past with a chiropractor who did x-rays that showed some degenerative changes and possible old healed fracture.  No bowel or bladder dysfunction radiating pain weakness or numbness distally.   ROS:  As above  Exam:  BP 134/88   Pulse 83   Temp 98.2 F (36.8 C) (Oral)   Ht 6\' 1"  (1.854 m)   Wt 250 lb (113.4 kg)   SpO2 99%   BMI 32.98 kg/m  Wt Readings from Last 5 Encounters:  11/22/18 250 lb (113.4 kg)  10/05/18 260 lb (117.9 kg)  09/19/18 260 lb (117.9 kg)  06/21/18 276 lb (125.2 kg)  05/24/18 288 lb (130.6 kg)   General: Well Developed, well nourished, and in no acute distress.  Neuro/Psych: Alert and oriented x3, extra-ocular muscles intact, able to move all 4 extremities, sensation grossly intact. Skin: Warm and dry, no rashes noted.  Respiratory: Not using accessory muscles, speaking in full sentences, trachea midline.  Cardiovascular: Pulses palpable, no extremity edema. Abdomen: Does not appear distended. MSK:  Spine: T-spine nontender to spinal midline.  Decreased thoracic motion. L-spine: Nontender to spinal midline.  Tender palpation lumbar paraspinal musculature around L1  or T12. Decreased lumbar motion.  Lower extremity strength reflexes and sensation are equal and normal throughout. Mild antalgic gait.    Lab and Radiology Results X-ray images obtained today personally independently reviewed. T-spine: Mild scoliosis convex right.  Minimal degenerative changes present.  No fracture visible. L-spine: Degenerative changes present at L5-S1 with facet hypertrophy.  Possible pars defects no anterior listhesis visible. No fractures visible. Await formal radiology review    Assessment and Plan: 38 y.o. male with  Thoracic and lumbar back pain.  Likely related to myofascial strain and dysfunction and spasm.  Patient does have some degenerative changes in his T and L-spine and possible pars defects at L5-S1.  Plan for physical therapy trial.  Use Tylenol ibuprofen heating pad TENS unit as well as cyclobenzaprine as needed.  Recheck as scheduled in about a month. Await formal radiology over read regarding x-ray.  If needed MRI L-spine will be very helpful.   PDMP not reviewed this encounter. No orders of the defined types were placed in this encounter.  No orders of the defined types were placed in this encounter.   Historical information moved to improve visibility of documentation.  Past Medical History:  Diagnosis Date  . ADHD   . Hypertension    Past Surgical History:  Procedure Laterality Date  . ROTATOR CUFF REPAIR Right 2018  . thumb surg near  amputation retached Right 03/29/2018   [facility]   Social History   Tobacco Use  . Smoking status: Never Smoker  . Smokeless tobacco: Never Used  Substance Use Topics  . Alcohol use: No    Frequency: Never   family history includes Cancer (age of onset: 9074) in his father; Hypertension in his father.  Medications: Current Outpatient Medications  Medication Sig Dispense Refill  . acetaminophen (TYLENOL) 325 MG tablet Take 2 tablets (650 mg total) by mouth every 6 (six) hours.    Marland Kitchen.  amphetamine-dextroamphetamine (ADDERALL) 20 MG tablet Take 1 tablet (20 mg total) by mouth 2 (two) times daily. 180 tablet 0  . FLUoxetine (PROZAC) 20 MG capsule Take 1 capsule (20 mg total) by mouth daily. 90 capsule 0  . ibuprofen (ADVIL) 200 MG tablet Take 3 tablets (600 mg total) by mouth every 6 (six) hours.    Marland Kitchen. lisinopril (PRINIVIL,ZESTRIL) 10 MG tablet Take 1 tablet (10 mg total) by mouth daily. 90 tablet 1  . omeprazole (PRILOSEC) 40 MG capsule Take 1 capsule (40 mg total) by mouth daily. 30 capsule 3  . tadalafil (ADCIRCA/CIALIS) 20 MG tablet Take 0.5-1 tablets (10-20 mg total) by mouth every other day as needed for erectile dysfunction. 30 tablet 11   No current facility-administered medications for this visit.    No Known Allergies    Discussed warning signs or symptoms. Please see discharge instructions. Patient expresses understanding.

## 2018-12-04 ENCOUNTER — Ambulatory Visit (INDEPENDENT_AMBULATORY_CARE_PROVIDER_SITE_OTHER): Payer: Federal, State, Local not specified - PPO | Admitting: Psychology

## 2018-12-04 DIAGNOSIS — F1020 Alcohol dependence, uncomplicated: Secondary | ICD-10-CM

## 2018-12-18 ENCOUNTER — Ambulatory Visit: Payer: Self-pay | Admitting: Psychology

## 2018-12-20 ENCOUNTER — Telehealth (INDEPENDENT_AMBULATORY_CARE_PROVIDER_SITE_OTHER): Payer: Federal, State, Local not specified - PPO | Admitting: Family Medicine

## 2018-12-20 DIAGNOSIS — Z91199 Patient's noncompliance with other medical treatment and regimen due to unspecified reason: Secondary | ICD-10-CM

## 2018-12-20 DIAGNOSIS — Z5329 Procedure and treatment not carried out because of patient's decision for other reasons: Secondary | ICD-10-CM

## 2018-12-20 NOTE — Progress Notes (Signed)
No-show virtual visit

## 2019-01-10 ENCOUNTER — Encounter: Payer: Self-pay | Admitting: Family Medicine

## 2019-01-10 ENCOUNTER — Ambulatory Visit (INDEPENDENT_AMBULATORY_CARE_PROVIDER_SITE_OTHER): Payer: Federal, State, Local not specified - PPO | Admitting: Family Medicine

## 2019-01-10 VITALS — Temp 98.0°F | Ht 73.0 in | Wt 257.0 lb

## 2019-01-10 DIAGNOSIS — F411 Generalized anxiety disorder: Secondary | ICD-10-CM | POA: Diagnosis not present

## 2019-01-10 DIAGNOSIS — F902 Attention-deficit hyperactivity disorder, combined type: Secondary | ICD-10-CM

## 2019-01-10 DIAGNOSIS — I10 Essential (primary) hypertension: Secondary | ICD-10-CM

## 2019-01-10 MED ORDER — FLUOXETINE HCL 20 MG PO CAPS: 20.0000 mg | ORAL_CAPSULE | Freq: Every day | ORAL | 3 refills | Status: DC

## 2019-01-10 MED ORDER — AMPHETAMINE-DEXTROAMPHETAMINE 20 MG PO TABS: 20.0000 mg | ORAL_TABLET | Freq: Two times a day (BID) | ORAL | 0 refills | Status: DC

## 2019-01-10 NOTE — Patient Instructions (Signed)
Thank you for coming in today.  Continue current medications.  Keep track of blood pressure at home.  If all is well check back in 6 months likely with Dr. Sheppard Coil.  I will be moving to full time Sports Medicine in [place] starting on November 1st.  You will still be able to see me for your Sports Medicine or Orthopedic needs in the [facility] Location. I will still be part of [facility].   [address], [place] [address] [phone]  Telephone (phone line will be functional starting in November).  [phone] Fax [phone] Concussion Line  If you want to stay locally for your Sports Medicine issues Dr. Dianah Field here in [place] will be happy to see you.  Additionally Dr. Clearance Coots at [facility] will be happy to see you for sports medicine issues more locally.   For your primary care needs you are welcome to establish care with Dr. Emeterio Reeve.  We are working quickly to hire more physicians to cover the primary care needs however if you cannot get an appointment with Dr. Sheppard Coil in a timely manner [facility] has locations and openings for primary care services nearby.   [facility] at [facility] [address] . [employer] , [place]: [phone] . Behavioral Medicine: [phone] . Fax: [place] at [employer] [address] . [place], [place]: [phone] . Behavioral Medicine: [phone] . Fax: [phone] . Hours (M-F): 7am - Academic librarian At [facility]. [place], [place]: [phone] . Behavioral Medicine: [phone] . Fax: [phone] . Hours (M-F): 8am - Optician, dispensing at [employer] . [place], [place] Phone: [phone] . Behavioral Medicine: [phone] . Fax: [phone]

## 2019-01-10 NOTE — Progress Notes (Signed)
Virtual Visit  via Video Note  I connected with      Brett Brown by a video enabled teSalley Slaughterlemedicine application and verified that I am speaking with the correct person using two identifiers.   I discussed the limitations of evaluation and management by telemedicine and the availability of in person appointments. The patient expressed understanding and agreed to proceed.  History of Present Illness: Brett SlaughterDavid L Brown is a 38 y.o. male who would like to discuss anxiety ADHD and hypertension.  Anxiety: Previously evaluated and started on Prozac a few months ago.  Currently taking 20 mg daily and tolerating it very well.  He notes his anxiety is significantly reduced and is very happy with how things are going.   ADHD: Doing well on 20 mg of immediate release Adderall twice daily.  Again he tolerates the medication well and is happy with how things are going.  He notes increased ability to focus.  Hypertension: Previously taking lisinopril 10 mg daily.  However with weight loss his blood pressure has been better controlled at home.  He notes blood pressures in the 120s systolic or lower without lisinopril. Started on Prozac in the past for anxiety did pretty well with 20 mg. He is doing a lot better. Sleeping well.     Observations/Objective: Temp 98 F (36.7 C) (Oral)   Ht 6\' 1"  (1.854 m)   Wt 257 lb (116.6 kg)   BMI 33.91 kg/m  Wt Readings from Last 5 Encounters:  01/10/19 257 lb (116.6 kg)  11/22/18 250 lb (113.4 kg)  10/05/18 260 lb (117.9 kg)  09/19/18 260 lb (117.9 kg)  06/21/18 276 lb (125.2 kg)   Exam: Appearance nontoxic Normal Speech.     Assessment and Plan: 38 y.o. male with  ADHD.  Doing well continue current regimen.  Recheck 6 months.  Will fill in 3 months without visit.  Anxiety: Doing well Prozac 20.  Continue current regimen check back 6 months.  Hypertension: Significant improvement with weight loss.  No longer taking medication.  Watchful  waiting home blood pressure monitoring.   Patient declined influenza vaccine.  I informed patient that I am transitioning to sports medicine only  sports medicine in [place] starting in November.  Happy to see patient for continued sports medicine needs.  Discussed need for new PCP.  Provided some recommendations.   PDMP reviewed during this encounter. No orders of the defined types were placed in this encounter.  Meds ordered this encounter  Medications  . FLUoxetine (PROZAC) 20 MG capsule    Sig: Take 1 capsule (20 mg total) by mouth daily.    Dispense:  90 capsule    Refill:  3  . amphetamine-dextroamphetamine (ADDERALL) 20 MG tablet    Sig: Take 1 tablet (20 mg total) by mouth 2 (two) times daily.    Dispense:  180 tablet    Refill:  0    Follow Up Instructions:    I discussed the assessment and treatment plan with the patient. The patient was provided an opportunity to ask questions and all were answered. The patient agreed with the plan and demonstrated an understanding of the instructions.   The patient was advised to call back or seek an in-person evaluation if the symptoms worsen or if the condition fails to improve as anticipated.  Time: 15 minutes of intraservice time, with >22 minutes of total time during today's visit.      Historical information moved to improve visibility of  documentation.  Past Medical History:  Diagnosis Date  . ADHD   . Hypertension    Past Surgical History:  Procedure Laterality Date  . ROTATOR CUFF REPAIR Right 2018  . thumb surg near amputation retached Right 03/29/2018   [facility]   Social History   Tobacco Use  . Smoking status: Never Smoker  . Smokeless tobacco: Never Used  Substance Use Topics  . Alcohol use: No    Frequency: Never   family history includes Cancer (age of onset: 59) in his father; Hypertension in his father.  Medications: Current Outpatient Medications  Medication Sig  Dispense Refill  . acetaminophen (TYLENOL) 325 MG tablet Take 2 tablets (650 mg total) by mouth every 6 (six) hours.    Marland Kitchen amphetamine-dextroamphetamine (ADDERALL) 20 MG tablet Take 1 tablet (20 mg total) by mouth 2 (two) times daily. 180 tablet 0  . FLUoxetine (PROZAC) 20 MG capsule Take 1 capsule (20 mg total) by mouth daily. 90 capsule 3  . ibuprofen (ADVIL) 200 MG tablet Take 3 tablets (600 mg total) by mouth every 6 (six) hours.    . tadalafil (ADCIRCA/CIALIS) 20 MG tablet Take 0.5-1 tablets (10-20 mg total) by mouth every other day as needed for erectile dysfunction. 30 tablet 11   No current facility-administered medications for this visit.    No Known Allergies

## 2019-04-17 ENCOUNTER — Telehealth: Payer: Self-pay | Admitting: Family Medicine

## 2019-04-17 NOTE — Telephone Encounter (Signed)
Received fax for PA on amphetamine-Dextroamphetamine sent through cover my meds and received authorization.   Valid 03/18/19 - 04/16/20   I will notify the pharmacy. - CF

## 2019-04-17 NOTE — Telephone Encounter (Signed)
Patient is needing a refill on amphetamine-dextroamphetamine (ADDERALL) 20 MG tablet [[id number]] . Please advise.

## 2019-04-18 ENCOUNTER — Other Ambulatory Visit: Payer: Self-pay | Admitting: Family Medicine

## 2019-04-18 MED ORDER — AMPHETAMINE-DEXTROAMPHETAMINE 20 MG PO TABS: 20.0000 mg | ORAL_TABLET | Freq: Two times a day (BID) | ORAL | 0 refills | Status: DC

## 2019-04-18 NOTE — Telephone Encounter (Signed)
Per wife, she will call back closer to February to make that appointment. No further questions at this time.

## 2019-04-18 NOTE — Telephone Encounter (Signed)
Dr Georgina Snell suggested a follow up in February.   Brett Brown,  Please have patient schedule an establish care appointment.

## 2019-06-18 ENCOUNTER — Encounter: Payer: Self-pay | Admitting: Family Medicine

## 2019-06-18 ENCOUNTER — Telehealth (INDEPENDENT_AMBULATORY_CARE_PROVIDER_SITE_OTHER): Payer: Federal, State, Local not specified - PPO | Admitting: Family Medicine

## 2019-06-18 DIAGNOSIS — F411 Generalized anxiety disorder: Secondary | ICD-10-CM

## 2019-06-18 DIAGNOSIS — N529 Male erectile dysfunction, unspecified: Secondary | ICD-10-CM | POA: Diagnosis not present

## 2019-06-18 DIAGNOSIS — F902 Attention-deficit hyperactivity disorder, combined type: Secondary | ICD-10-CM | POA: Diagnosis not present

## 2019-06-18 MED ORDER — AMPHETAMINE-DEXTROAMPHETAMINE 20 MG PO TABS: 20.0000 mg | ORAL_TABLET | Freq: Two times a day (BID) | ORAL | 0 refills | Status: DC

## 2019-06-18 MED ORDER — TADALAFIL 20 MG PO TABS: 10.0000 mg | ORAL_TABLET | ORAL | 11 refills | Status: DC | PRN

## 2019-06-18 MED ORDER — FLUOXETINE HCL 20 MG PO CAPS: 20.0000 mg | ORAL_CAPSULE | Freq: Every day | ORAL | 3 refills | Status: DC

## 2019-06-18 NOTE — Assessment & Plan Note (Signed)
Stable with fluoxetine, continue at current dose.

## 2019-06-18 NOTE — Progress Notes (Addendum)
Brett Brown - 39 y.o. male MRN [id number]  Date of birth: 1980-11-28   This visit type was conducted due to national recommendations for restrictions regarding the COVID-19 Pandemic (e.g. social distancing).  This format is felt to be most appropriate for this patient at this time.  All issues noted in this document were discussed and addressed.  No physical exam was performed (except for noted visual exam findings with Video Visits).  I discussed the limitations of evaluation and management by telemedicine and the availability of in person appointments. The patient expressed understanding and agreed to proceed.  I connected with@ on 06/18/19 at  4:00 PM EST by a video enabled telemedicine application and verified that I am speaking with the correct person using two identifiers.  Present at visit: Luetta Nutting, DO Glyn Ade   Patient Location: Home [address] [place] [address]   Provider location:   [place]  Chief Complaint  Patient presents with  . ADHD    HPI  Brett Brown is a 39 y.o. male who presents via audio/video conferencing for a telehealth visit today.  He is following up today for ADHD, anxiety and ED.  He reports he is doing well at this time.   ADHD:  Current management with adderall 20mg  BID.  He has been on this current dosing for about a year.  Previous on stimulant as a child as well.  He is doing well with current dosing.  He denies side effects.  Mild insomnia if he takes afternoon dose too late.   Anxiety: Current management with fluoxetine.  Doing well with this, reports anxiety is much better managed.  He is not having any side effects with this at this time.    ED:  Using tadalafil as needed which is working well for him.  No side effects noted with current medication.      ROS:  A comprehensive ROS was completed and negative except as noted per HPI  Past Medical History:  Diagnosis Date  .  ADHD   . Hypertension     Past Surgical History:  Procedure Laterality Date  . ROTATOR CUFF REPAIR Right 2018  . thumb surg near amputation retached Right 03/29/2018   [facility]    Family History  Problem Relation Age of Onset  . Hypertension Father   . Cancer Father 82       bladder cancer    Social History   Socioeconomic History  . Marital status: Married    Spouse name: Not on file  . Number of children: Not on file  . Years of education: Not on file  . Highest education level: Not on file  Occupational History  . Not on file  Tobacco Use  . Smoking status: Never Smoker  . Smokeless tobacco: Never Used  Substance and Sexual Activity  . Alcohol use: No  . Drug use: No  . Sexual activity: Not on file  Other Topics Concern  . Not on file  Social History Narrative  . Not on file   Social Determinants of Health   Financial Resource Strain:   . Difficulty of Paying Living Expenses: Not on file  Food Insecurity:   . Worried About Charity fundraiser in the Last Year: Not on file  . Ran Out of Food in the Last Year: Not on file  Transportation Needs:   . Lack of Transportation (Medical): Not on file  . Lack of  Transportation (Non-Medical): Not on file  Physical Activity:   . Days of Exercise per Week: Not on file  . Minutes of Exercise per Session: Not on file  Stress:   . Feeling of Stress : Not on file  Social Connections:   . Frequency of Communication with Friends and Family: Not on file  . Frequency of Social Gatherings with Friends and Family: Not on file  . Attends Religious Services: Not on file  . Active Member of Clubs or Organizations: Not on file  . Attends Banker Meetings: Not on file  . Marital Status: Not on file  Intimate Partner Violence:   . Fear of Current or Ex-Partner: Not on file  . Emotionally Abused: Not on file  . Physically Abused: Not on file  . Sexually Abused: Not on file     Current Outpatient  Medications:  .  acetaminophen (TYLENOL) 325 MG tablet, Take 2 tablets (650 mg total) by mouth every 6 (six) hours., Disp: , Rfl:  .  amphetamine-dextroamphetamine (ADDERALL) 20 MG tablet, Take 1 tablet (20 mg total) by mouth 2 (two) times daily., Disp: 180 tablet, Rfl: 0 .  FLUoxetine (PROZAC) 20 MG capsule, Take 1 capsule (20 mg total) by mouth daily., Disp: 90 capsule, Rfl: 3 .  ibuprofen (ADVIL) 200 MG tablet, Take 3 tablets (600 mg total) by mouth every 6 (six) hours., Disp: , Rfl:  .  tadalafil (CIALIS) 20 MG tablet, Take 0.5-1 tablets (10-20 mg total) by mouth every other day as needed for erectile dysfunction., Disp: 30 tablet, Rfl: 11  EXAM:  VITALS per patient if applicable: There were no vitals taken for this visit.  GENERAL: alert, oriented, appears well and in no acute distress  HEENT: atraumatic, conjunttiva clear, no obvious abnormalities on inspection of external nose and ears  NECK: normal movements of the head and neck  LUNGS: on inspection no signs of respiratory distress, breathing rate appears normal, no obvious gross SOB, gasping or wheezing  CV: no obvious cyanosis  MS: moves all visible extremities without noticeable abnormality  PSYCH/NEURO: pleasant and cooperative, no obvious depression or anxiety, speech and thought processing grossly intact  ASSESSMENT AND PLAN:  Discussed the following assessment and plan:  ADHD Doing well with current IR adderall 20mg  BID.  Minimal side effects of insomnia only occur if he takes too late in the afternoon.  Continue at current dose, rx renewed.    GAD (generalized anxiety disorder) Stable with fluoxetine, continue at current dose.   ED (erectile dysfunction) ED is well managed with tadalafil as needed, continue at current dose.    >20 minutes spent including pre visit preparation, review of prior notes and labs, encounter with patient via video visit and same day documentation.    I discussed the assessment and  treatment plan with the patient. The patient was provided an opportunity to ask questions and all were answered. The patient agreed with the plan and demonstrated an understanding of the instructions.   The patient was advised to call back or seek an in-person evaluation if the symptoms worsen or if the condition fails to improve as anticipated.    , DO

## 2019-06-18 NOTE — Assessment & Plan Note (Signed)
ED is well managed with tadalafil as needed, continue at current dose.

## 2019-06-18 NOTE — Assessment & Plan Note (Signed)
Doing well with current IR adderall 20mg  BID.  Minimal side effects of insomnia only occur if he takes too late in the afternoon.  Continue at current dose, rx renewed.

## 2019-06-24 NOTE — Addendum Note (Signed)
Addended by: Mammie Lorenzo on: 06/24/2019 08:56 PM   Modules accepted: Level of Service

## 2019-10-09 ENCOUNTER — Ambulatory Visit (INDEPENDENT_AMBULATORY_CARE_PROVIDER_SITE_OTHER): Payer: Federal, State, Local not specified - PPO | Admitting: Osteopathic Medicine

## 2019-10-09 ENCOUNTER — Encounter: Payer: Self-pay | Admitting: Osteopathic Medicine

## 2019-10-09 VITALS — BP 153/98 | HR 96 | Temp 98.1°F | Wt 283.1 lb

## 2019-10-09 DIAGNOSIS — F902 Attention-deficit hyperactivity disorder, combined type: Secondary | ICD-10-CM | POA: Diagnosis not present

## 2019-10-09 DIAGNOSIS — F411 Generalized anxiety disorder: Secondary | ICD-10-CM | POA: Diagnosis not present

## 2019-10-09 DIAGNOSIS — R030 Elevated blood-pressure reading, without diagnosis of hypertension: Secondary | ICD-10-CM

## 2019-10-09 DIAGNOSIS — R6882 Decreased libido: Secondary | ICD-10-CM | POA: Diagnosis not present

## 2019-10-09 DIAGNOSIS — N529 Male erectile dysfunction, unspecified: Secondary | ICD-10-CM

## 2019-10-09 MED ORDER — FLUOXETINE HCL 10 MG PO CAPS
10.0000 mg | ORAL_CAPSULE | Freq: Every day | ORAL | 0 refills | Status: DC
Start: 2019-10-09 — End: 2019-11-13

## 2019-10-09 MED ORDER — TADALAFIL 20 MG PO TABS: 10.0000 mg | ORAL_TABLET | ORAL | 11 refills | Status: DC | PRN

## 2019-10-09 MED ORDER — FLUOXETINE HCL 20 MG PO CAPS: 20.0000 mg | ORAL_CAPSULE | Freq: Every day | ORAL | 3 refills | Status: DC

## 2019-10-09 MED ORDER — AMPHETAMINE-DEXTROAMPHETAMINE 20 MG PO TABS: 20.0000 mg | ORAL_TABLET | Freq: Two times a day (BID) | ORAL | 0 refills | Status: DC

## 2019-10-09 NOTE — Progress Notes (Signed)
Brett Brown is a 39 y.o. male who presents to  [place] at [facility]  today, 10/09/19, seeking care for the following: . ADD RX . BP CHECK . REFILLS . MENTAL HEALTH - anxiety is higher, would like to maybe go up on [place] with other pertinent history/findings:  The primary encounter diagnosis was Attention deficit hyperactivity disorder (ADHD), combined type. Diagnoses of GAD (generalized anxiety disorder), Erectile dysfunction, unspecified erectile dysfunction type, Low libido, and Elevated blood pressure reading were also pertinent to this visit.  BP Readings from Last 3 Encounters:  10/09/19 (!) 153/98  11/22/18 134/88  06/21/18 117/72   --> increase Prozac from 20 to 30 mg daily  --> labs as below include testosterone and TSH   Orders Placed This Encounter  Procedures  . COMPLETE METABOLIC PANEL WITH GFR  . CBC  . Lipid panel  . TSH  . Testosterone    Meds ordered this encounter  Medications  . tadalafil (CIALIS) 20 MG tablet    Sig: Take 0.5-1 tablets (10-20 mg total) by mouth every other day as needed for erectile dysfunction.    Dispense:  30 tablet    Refill:  11  . FLUoxetine (PROZAC) 20 MG capsule    Sig: Take 1 capsule (20 mg total) by mouth daily. Take in combination w/ 10 mg dose for TDD 30 mg    Dispense:  90 capsule    Refill:  3  . FLUoxetine (PROZAC) 10 MG capsule    Sig: Take 1 capsule (10 mg total) by mouth daily. Take in combination w/ 20 mg dose for TDD 30 mg    Dispense:  90 capsule    Refill:  0  . amphetamine-dextroamphetamine (ADDERALL) 20 MG tablet    Sig: Take 1 tablet (20 mg total) by mouth 2 (two) times daily.    Dispense:  180 tablet    Refill:  0       Follow-up instructions: Return in about 4 weeks (around 11/06/2019) for VIRTUAL VISIT or IN-OFFICE VISIT TO FOLLOW UP ON RX CHANGE FOR MENTAL HEALTH  .                                         BP (!) 153/98 (BP Location: Left Arm, Patient Position: Sitting, Cuff Size: Large)   Pulse 96   Temp 98.1 F (36.7 C) (Oral)   Wt 283 lb 1.9 oz (128.4 kg)   BMI 37.35 kg/m   Current Meds  Medication Sig  . acetaminophen (TYLENOL) 325 MG tablet Take 2 tablets (650 mg total) by mouth every 6 (six) hours.  Marland Kitchen amphetamine-dextroamphetamine (ADDERALL) 20 MG tablet Take 1 tablet (20 mg total) by mouth 2 (two) times daily.  Marland Kitchen FLUoxetine (PROZAC) 20 MG capsule Take 1 capsule (20 mg total) by mouth daily. Take in combination w/ 10 mg dose for TDD 30 mg  . ibuprofen (ADVIL) 200 MG tablet Take 3 tablets (600 mg total) by mouth every 6 (six) hours.  . tadalafil (CIALIS) 20 MG tablet Take 0.5-1 tablets (10-20 mg total) by mouth every other day as needed for erectile dysfunction.  . [DISCONTINUED] amphetamine-dextroamphetamine (ADDERALL) 20 MG tablet Take 1 tablet (20 mg total) by mouth 2 (two) times daily.  . [DISCONTINUED] FLUoxetine (PROZAC) 20 MG capsule Take 1 capsule (20 mg total)  by mouth daily.  . [DISCONTINUED] tadalafil (CIALIS) 20 MG tablet Take 0.5-1 tablets (10-20 mg total) by mouth every other day as needed for erectile dysfunction.    No results found for this or any previous visit (from the past 72 hour(s)).  No results found.  Depression screen [facility] 2/9 10/19/2018 10/05/2018 05/24/2018  Decreased Interest 1 1 0  Down, Depressed, Hopeless 0 1 0  PHQ - 2 Score 1 2 0  Altered sleeping 1 3 0  Tired, decreased energy 1 0 0  Change in appetite 0 0 0  Feeling bad or failure about yourself  0 0 0  Trouble concentrating 2 1 1   Moving slowly or fidgety/restless 1 0 1  Suicidal thoughts 0 0 0  PHQ-9 Score 6 6 2   Difficult doing work/chores Not difficult at all Somewhat difficult Not difficult at all    GAD 7 : Generalized Anxiety Score 01/10/2019 10/19/2018 10/05/2018 05/24/2018  Nervous, Anxious, on Edge 0 2 1 0   Control/stop worrying 0 2 1 0  Worry too much - different things 0 1 1 0  Trouble relaxing 0 1 1 0  Restless 0 0 0 0  Easily annoyed or irritable 0 0 0 0  Afraid - awful might happen 0 1 3 0  Total GAD 7 Score 0 7 7 0  Anxiety Difficulty Not difficult at all Not difficult at all Somewhat difficult Not difficult at all      All questions at time of visit were answered - patient instructed to contact office with any additional concerns or updates.  ER/RTC precautions were reviewed with the patient.  Please note: voice recognition software was used to produce this document, and typos may escape review. Please contact Dr. 10/07/2018 for any needed clarifications.

## 2019-11-13 ENCOUNTER — Telehealth (INDEPENDENT_AMBULATORY_CARE_PROVIDER_SITE_OTHER): Payer: Federal, State, Local not specified - PPO | Admitting: Osteopathic Medicine

## 2019-11-13 ENCOUNTER — Encounter: Payer: Self-pay | Admitting: Osteopathic Medicine

## 2019-11-13 DIAGNOSIS — F902 Attention-deficit hyperactivity disorder, combined type: Secondary | ICD-10-CM

## 2019-11-13 DIAGNOSIS — F411 Generalized anxiety disorder: Secondary | ICD-10-CM | POA: Diagnosis not present

## 2019-11-13 MED ORDER — AMPHETAMINE-DEXTROAMPHETAMINE 20 MG PO TABS: 20.0000 mg | ORAL_TABLET | Freq: Two times a day (BID) | ORAL | 0 refills | Status: DC

## 2019-11-13 MED ORDER — FLUOXETINE HCL 10 MG PO CAPS: 10.0000 mg | ORAL_CAPSULE | Freq: Every day | ORAL | 3 refills | Status: DC

## 2019-11-13 NOTE — Progress Notes (Signed)
Virtual Visit via Video (App used: MyChart) Note  I connected with      Brett Brown on 11/13/19 at 2:38 PM  by a telemedicine application and verified that I am speaking with the correct person using two identifiers.  Patient is in separate location  I am in office   I discussed the limitations of evaluation and management by telemedicine and the availability of in person appointments. The patient expressed understanding and agreed to proceed.  History of Present Illness: Brett Brown is a 39 y.o. male who would like to discuss mental health    Last visit we increased Prozac from 20 to 30 mg given higher anxiety levels, he reports this is working well. Also at that visit we opted to continue Adderall 20 mg bid, 90 days supply given.   BP at [facility] ok per patient, systolic 120s, diastolic 80s   BP Readings from Last 3 Encounters:  10/09/19 (!) 153/98  11/22/18 134/88  06/21/18 117/72     Depression screen PHQ 2/9 11/13/2019 10/19/2018 10/05/2018  Decreased Interest 0 1 1  Down, Depressed, Hopeless 0 0 1  PHQ - 2 Score 0 1 2  Altered sleeping 0 1 3  Tired, decreased energy 0 1 0  Change in appetite 0 0 0  Feeling bad or failure about yourself  0 0 0  Trouble concentrating 0 2 1  Moving slowly or fidgety/restless 0 1 0  Suicidal thoughts 0 0 0  PHQ-9 Score 0 6 6  Difficult doing work/chores Not difficult at all Not difficult at all Somewhat difficult   GAD 7 : Generalized Anxiety Score 11/13/2019 01/10/2019 10/19/2018 10/05/2018  Nervous, Anxious, on Edge 0 0 2 1  Control/stop worrying 0 0 2 1  Worry too much - different things 0 0 1 1  Trouble relaxing 0 0 1 1  Restless 0 0 0 0  Easily annoyed or irritable 0 0 0 0  Afraid - awful might happen 0 0 1 3  Total GAD 7 Score 0 0 7 7  Anxiety Difficulty Not difficult at all Not difficult at all Not difficult at all Somewhat difficult       Observations/Objective: There were no vitals taken for this  visit. BP Readings from Last 3 Encounters:  10/09/19 (!) 153/98  11/22/18 134/88  06/21/18 117/72   Exam: Normal Speech.  NAD  Lab and Radiology Results No results found for this or any previous visit (from the past 72 hour(s)). No results found.     Assessment and Plan: 39 y.o. male with The primary encounter diagnosis was GAD (generalized anxiety disorder). A diagnosis of Attention deficit hyperactivity disorder (ADHD), combined type was also pertinent to this visit.  Anxiety much better on increase SSRI Continue stimulant as-is for ADHD   PDMP not reviewed this encounter. No orders of the defined types were placed in this encounter.  Meds ordered this encounter  Medications  . FLUoxetine (PROZAC) 10 MG capsule    Sig: Take 1 capsule (10 mg total) by mouth daily. Take in combination w/ 20 mg dose for TDD 30 mg    Dispense:  90 capsule    Refill:  3  . amphetamine-dextroamphetamine (ADDERALL) 20 MG tablet    Sig: Take 1 tablet (20 mg total) by mouth 2 (two) times daily.    Dispense:  180 tablet    Refill:  0    Follow Up Instructions: Return in about 5 months (around 04/14/2020)  for VIRTUAL OR IN OFFICE MONNITOR BP AND REFILL ADD RX .    I discussed the assessment and treatment plan with the patient. The patient was provided an opportunity to ask questions and all were answered. The patient agreed with the plan and demonstrated an understanding of the instructions.   The patient was advised to call back or seek an in-person evaluation if any new concerns, if symptoms worsen or if the condition fails to improve as anticipated.  20 minutes of non-face-to-face time was provided during this encounter.      . . . . . . . . . . . . . Marland Kitchen                   Historical information moved to improve visibility of documentation.  Past Medical History:  Diagnosis Date  . ADHD   . Hypertension    Past Surgical History:  Procedure Laterality  Date  . ROTATOR CUFF REPAIR Right 2018  . thumb surg near amputation retached Right 03/29/2018   [facility]   Social History   Tobacco Use  . Smoking status: Never Smoker  . Smokeless tobacco: Never Used  Substance Use Topics  . Alcohol use: No   family history includes Cancer (age of onset: 75) in his father; Hypertension in his father.  Medications: Current Outpatient Medications  Medication Sig Dispense Refill  . [START ON 01/07/2020] amphetamine-dextroamphetamine (ADDERALL) 20 MG tablet Take 1 tablet (20 mg total) by mouth 2 (two) times daily. 180 tablet 0  . FLUoxetine (PROZAC) 10 MG capsule Take 1 capsule (10 mg total) by mouth daily. Take in combination w/ 20 mg dose for TDD 30 mg 90 capsule 3  . FLUoxetine (PROZAC) 20 MG capsule Take 1 capsule (20 mg total) by mouth daily. Take in combination w/ 10 mg dose for TDD 30 mg 90 capsule 3  . ibuprofen (ADVIL) 200 MG tablet Take 3 tablets (600 mg total) by mouth every 6 (six) hours.    . tadalafil (CIALIS) 20 MG tablet Take 0.5-1 tablets (10-20 mg total) by mouth every other day as needed for erectile dysfunction. 30 tablet 11   No current facility-administered medications for this visit.   No Known Allergies

## 2019-11-13 NOTE — Progress Notes (Signed)
Patient contacted @ 2:15 first attempt to start virtual visit, left message on patients VM. Will try and call again closer to appointment time.

## 2020-01-11 ENCOUNTER — Other Ambulatory Visit: Payer: Self-pay | Admitting: Osteopathic Medicine

## 2020-01-11 MED ORDER — AMPHETAMINE-DEXTROAMPHETAMINE 20 MG PO TABS: 20.0000 mg | ORAL_TABLET | Freq: Two times a day (BID) | ORAL | 0 refills | Status: DC

## 2020-01-11 NOTE — Telephone Encounter (Signed)
Last written 01/07/2020 #180 no refills.  Too soon. Please sign denial.

## 2020-02-27 ENCOUNTER — Emergency Department
Admission: RE | Admit: 2020-02-27 | Discharge: 2020-02-27 | Disposition: A | Discharge status: Home / Self Care | Payer: Federal, State, Local not specified - PPO | Source: Ambulatory Visit | Attending: Family Medicine | Admitting: Family Medicine

## 2020-02-27 ENCOUNTER — Other Ambulatory Visit: Payer: Self-pay

## 2020-02-27 ENCOUNTER — Emergency Department (INDEPENDENT_AMBULATORY_CARE_PROVIDER_SITE_OTHER): Payer: Federal, State, Local not specified - PPO

## 2020-02-27 VITALS — BP 155/95 | HR 122 | Temp 98.5°F | Resp 17

## 2020-02-27 DIAGNOSIS — S76012A Strain of muscle, fascia and tendon of left hip, initial encounter: Secondary | ICD-10-CM

## 2020-02-27 DIAGNOSIS — S43401A Unspecified sprain of right shoulder joint, initial encounter: Secondary | ICD-10-CM

## 2020-02-27 DIAGNOSIS — W11XXXA Fall on and from ladder, initial encounter: Secondary | ICD-10-CM | POA: Diagnosis not present

## 2020-02-27 DIAGNOSIS — M25552 Pain in left hip: Secondary | ICD-10-CM

## 2020-02-27 DIAGNOSIS — Y93H9 Activity, other involving exterior property and land maintenance, building and construction: Secondary | ICD-10-CM | POA: Diagnosis not present

## 2020-02-27 DIAGNOSIS — M25511 Pain in right shoulder: Secondary | ICD-10-CM

## 2020-02-27 NOTE — ED Triage Notes (Addendum)
Pt c/o LT hip/groin pain and RT shoulder pain x 4 days after falling off ladder.  Pain 7/10 Tylenol and motrin prn. Last dose this was motrin this am.

## 2020-02-27 NOTE — ED Provider Notes (Signed)
[facility]    CSN: [id number] Arrival date & time: 02/27/20  1025      History   Chief Complaint Chief Complaint  Patient presents with  . Appointment    11am  . Hip Pain    LT  . Shoulder Pain    RT    HPI Brett Brown is a 39 y.o. male.   While standing on top of an 8-foot ladder cleaning gutters 4 days ago, patient fell off injuring his right shoulder and left groin.  Since then he has been unable to abduct his right shoulder above horizontal, and has had persistent pain in his left groin area with walking and flexing his left hip.  Ibuprofen has been slightly helpful. Patient has a past history of right rotator cuff and labrum repair.  The history is provided by the patient.    Past Medical History:  Diagnosis Date  . ADHD   . Hypertension     Patient Active Problem List   Diagnosis Date Noted  . ED (erectile dysfunction) 06/18/2019  . GAD (generalized anxiety disorder) 10/05/2018  . HTN (hypertension) 04/19/2018  . ADHD 04/19/2018  . Obese 04/19/2018    Past Surgical History:  Procedure Laterality Date  . ROTATOR CUFF REPAIR Right 2018  . thumb surg near amputation retached Right 03/29/2018   [facility]       Home Medications    Prior to Admission medications   Medication Sig Start Date End Date Taking? Authorizing Provider  amphetamine-dextroamphetamine (ADDERALL) 20 MG tablet Take 1 tablet (20 mg total) by mouth 2 (two) times daily. 01/11/20 04/10/20  Sunnie Nielsen, DO  FLUoxetine (PROZAC) 10 MG capsule Take 1 capsule (10 mg total) by mouth daily. Take in combination w/ 20 mg dose for TDD 30 mg 11/13/19   Sunnie Nielsen, DO  FLUoxetine (PROZAC) 20 MG capsule Take 1 capsule (20 mg total) by mouth daily. Take in combination w/ 10 mg dose for TDD 30 mg 10/09/19   Sunnie Nielsen, DO  ibuprofen (ADVIL) 200 MG tablet Take 3 tablets (600 mg total) by mouth every 6 (six) hours. 03/29/18   Mack Hook, MD    tadalafil (CIALIS) 20 MG tablet Take 0.5-1 tablets (10-20 mg total) by mouth every other day as needed for erectile dysfunction. 10/09/19   Sunnie Nielsen, DO    Family History Family History  Problem Relation Age of Onset  . Hypertension Father   . Cancer Father 67       bladder cancer    Social History Social History   Tobacco Use  . Smoking status: Never Smoker  . Smokeless tobacco: Never Used  Vaping Use  . Vaping Use: Never used  Substance Use Topics  . Alcohol use: No  . Drug use: No     Allergies   Patient has no known allergies.   Review of Systems Review of Systems  Constitutional: Positive for activity change. Negative for appetite change, chills, diaphoresis, fatigue and fever.  HENT: Negative.   Eyes: Negative.   Respiratory: Negative for chest tightness and shortness of breath.   Cardiovascular: Negative for chest pain.  Gastrointestinal: Negative.   Genitourinary: Negative.   Musculoskeletal: Negative for back pain and neck pain.       Right shoulder pain and left groin pain  Skin: Negative.   Neurological: Negative for dizziness, syncope and headaches.     Physical Exam Triage Vital Signs ED Triage Vitals  Enc Vitals Group  BP 02/27/20 1103 (!) 155/95     Pulse Rate 02/27/20 1035 (!) 122     Resp 02/27/20 1035 17     Temp 02/27/20 1035 98.5 F (36.9 C)     Temp Source 02/27/20 1035 Oral     SpO2 02/27/20 1035 97 %     Weight --      Height --      Head Circumference --      Peak Flow --      Pain Score 02/27/20 1038 7     Pain Loc --      Pain Edu? --      Excl. in GC? --    No data found.  Updated Vital Signs BP (!) 155/95   Pulse (!) 122   Temp 98.5 F (36.9 C) (Oral)   Resp 17   SpO2 97%   Visual Acuity Right Eye Distance:   Left Eye Distance:   Bilateral Distance:    Right Eye Near:   Left Eye Near:    Bilateral Near:     Physical Exam Vitals and nursing note reviewed.  Constitutional:      General: He  is not in acute distress.    Appearance: He is not ill-appearing.  HENT:     Head: Atraumatic.     Right Ear: External ear normal.     Left Ear: External ear normal.     Nose: Nose normal.     Mouth/Throat:     Pharynx: Oropharynx is clear.  Eyes:     Conjunctiva/sclera: Conjunctivae normal.     Pupils: Pupils are equal, round, and reactive to light.  Cardiovascular:     Rate and Rhythm: Regular rhythm. Tachycardia present.     Heart sounds: Normal heart sounds.  Pulmonary:     Effort: Pulmonary effort is normal.  Abdominal:     Palpations: Abdomen is soft.     Tenderness: There is no abdominal tenderness.  Musculoskeletal:     Right shoulder: No tenderness or crepitus. Decreased range of motion.     Cervical back: Normal range of motion. No tenderness.     Right hip: Tenderness present. Decreased range of motion.       Legs:     Comments: Right shoulder:  Patient unable to actively abduct above horizontal.  Can passively abduct to about 15 degrees above horizontal. Apley's and empty can tests negative.  Good internal/external rotation strength and range of motion.  Distal neurovascular function is intact.   Left groin area has distinct tenderness to palpation over hip flexor.  Patient unable to actively flex his left hip, although he has minimal pain with passive flexion.  Passive internal/external rotation causes minimal pain.  There is tenderness to palpation over the left aspect of pubic symphysis.  Skin:    General: Skin is warm and dry.     Findings: No rash.  Neurological:     Mental Status: He is alert and oriented to person, place, and time.      UC Treatments / Results  Labs (all labs ordered are listed, but only abnormal results are displayed) Labs Reviewed - No data to display  EKG   Radiology DG Shoulder Right  Result Date: 02/27/2020 CLINICAL DATA:  Right shoulder pain after fall several days ago. EXAM: RIGHT SHOULDER - 2+ VIEW COMPARISON:  None.  FINDINGS: There is no evidence of fracture or dislocation. There is no evidence of arthropathy or other focal bone abnormality. Soft tissues are  unremarkable. IMPRESSION: Negative. Electronically Signed   By: Lupita Raider M.D.   On: 02/27/2020 11:32   DG Hip Unilat W or Wo Pelvis 2-3 Views Left  Result Date: 02/27/2020 CLINICAL DATA:  Left hip pain after fall 2 days ago. EXAM: DG HIP (WITH OR WITHOUT PELVIS) 2-3V LEFT COMPARISON:  None. FINDINGS: There is no evidence of hip fracture or dislocation. There is no evidence of arthropathy or other focal bone abnormality. IMPRESSION: Negative. Electronically Signed   By: Lupita Raider M.D.   On: 02/27/2020 11:31    Procedures Procedures (including critical care time)  Medications Ordered in UC Medications - No data to display  Initial Impression / Assessment and Plan / UC Course  I have reviewed the triage vital signs and the nursing notes.  Pertinent labs & imaging results that were available during my care of the patient were reviewed by me and considered in my medical decision making (see chart for details).    New onset right shoulder injury concerning with a past history of rotator cuff and labrum repair. Followup with Dr. Rodney [facility] ([facility] for further evaluation/management.   Final Clinical Impressions(s) / UC Diagnoses   Final diagnoses:  Strain of hip flexor, left, initial encounter  Sprain of right shoulder, unspecified shoulder sprain type, initial encounter     Discharge Instructions     Apply ice pack to right shoulder and left hip flexor muscles for 20 to 30 minutes, 3 to 4 times daily  Continue until pain and swelling decrease.  May continue Ibuprofen 200mg , 4 tabs every 8 hours with food.  Begin range of motion and stretching exercises as tolerated.   ED Prescriptions    None        , MD 03/02/20 1546

## 2020-02-27 NOTE — Discharge Instructions (Addendum)
Apply ice pack to right shoulder and left hip flexor muscles for 20 to 30 minutes, 3 to 4 times daily  Continue until pain and swelling decrease.  May continue Ibuprofen 200mg , 4 tabs every 8 hours with food.  Begin range of motion and stretching exercises as tolerated.

## 2020-04-08 ENCOUNTER — Other Ambulatory Visit: Payer: Self-pay | Admitting: Osteopathic Medicine

## 2020-04-15 ENCOUNTER — Telehealth (INDEPENDENT_AMBULATORY_CARE_PROVIDER_SITE_OTHER): Payer: Federal, State, Local not specified - PPO | Admitting: Osteopathic Medicine

## 2020-04-15 ENCOUNTER — Encounter: Payer: Self-pay | Admitting: Osteopathic Medicine

## 2020-04-15 VITALS — Wt 260.0 lb

## 2020-04-15 DIAGNOSIS — N529 Male erectile dysfunction, unspecified: Secondary | ICD-10-CM | POA: Diagnosis not present

## 2020-04-15 DIAGNOSIS — I10 Essential (primary) hypertension: Secondary | ICD-10-CM | POA: Diagnosis not present

## 2020-04-15 DIAGNOSIS — F902 Attention-deficit hyperactivity disorder, combined type: Secondary | ICD-10-CM

## 2020-04-15 DIAGNOSIS — F411 Generalized anxiety disorder: Secondary | ICD-10-CM

## 2020-04-15 MED ORDER — AMPHETAMINE-DEXTROAMPHETAMINE 20 MG PO TABS: 20.0000 mg | ORAL_TABLET | Freq: Two times a day (BID) | ORAL | 0 refills | Status: DC

## 2020-04-15 MED ORDER — FLUOXETINE HCL 10 MG PO CAPS
10.0000 mg | ORAL_CAPSULE | Freq: Every day | ORAL | 3 refills | Status: DC
Start: 2020-04-15 — End: 2020-12-26

## 2020-04-15 MED ORDER — FLUOXETINE HCL 20 MG PO CAPS
20.0000 mg | ORAL_CAPSULE | Freq: Every day | ORAL | 3 refills | Status: DC
Start: 2020-04-15 — End: 2020-12-26

## 2020-04-15 NOTE — Progress Notes (Signed)
Telemedicine Visit via  Video & Audio (App used: MyChart)   I connected with Brett Brown on 04/15/20 at 3:24 PM  by phone or  telemedicine application as noted above  I verified that I am speaking with or regarding  the correct patient using two identifiers.  Participants: Myself, Dr Sunnie Nielsen DO Patient: Brett Brown Patient proxy if applicable: none Other, if applicable: none  Patient is in separate location from myself  I am in office at [facility]    I discussed the limitations of evaluation and management  by telemedicine and the availability of in person appointments.  The participant(s) above expressed understanding and  agreed to proceed with this appointment via telemedicine.       History of Present Illness: Brett Brown is a 39 y.o. male who would like to discuss refill Rx    Last visit about 6 mos ago 11/13/2019 - doing well on Prozac 30 mg (up from 20 mg) and on Adderall 20 mg. Pt reports still doing well on these medications.     Depression screen [facility] 2/9 04/15/2020 11/13/2019 10/19/2018  Decreased Interest 0 0 1  Down, Depressed, Hopeless 0 0 0  PHQ - 2 Score 0 0 1  Altered sleeping 0 0 1  Tired, decreased energy 0 0 1  Change in appetite 0 0 0  Feeling bad or failure about yourself  0 0 0  Trouble concentrating 1 0 2  Moving slowly or fidgety/restless 0 0 1  Suicidal thoughts 0 0 0  PHQ-9 Score 1 0 6  Difficult doing work/chores Not difficult at all Not difficult at all Not difficult at all   GAD 7 : Generalized Anxiety Score 04/15/2020 11/13/2019 01/10/2019 10/19/2018  Nervous, Anxious, on Edge 0 0 0 2  Control/stop worrying 0 0 0 2  Worry too much - different things 0 0 0 1  Trouble relaxing 0 0 0 1  Restless 0 0 0 0  Easily annoyed or irritable 0 0 0 0  Afraid - awful might happen 0 0 0 1  Total GAD 7 Score 0 0 0 7  Anxiety Difficulty Not difficult at all Not difficult at all Not difficult at all Not  difficult at all         Observations/Objective: Wt 260 lb (117.9 kg)   BMI 34.30 kg/m  BP Readings from Last 3 Encounters:  02/27/20 (!) 155/95 - at [facility] for injury   10/09/19 (!) 153/98  11/22/18 134/88   Exam: Normal Speech.   Lab and Radiology Results No results found for this or any previous visit (from the past 72 hour(s)). No results found.     Assessment and Plan: 39 y.o. male with The primary encounter diagnosis was Attention deficit hyperactivity disorder (ADHD), combined type. Diagnoses of GAD (generalized anxiety disorder), Erectile dysfunction, unspecified erectile dysfunction type, and Essential hypertension were also pertinent to this visit.   PDMP not reviewed this encounter. No orders of the defined types were placed in this encounter.  Meds ordered this encounter  Medications  . FLUoxetine (PROZAC) 10 MG capsule    Sig: Take 1 capsule (10 mg total) by mouth daily. Take in combination w/ 20 mg dose for TDD 30 mg    Dispense:  90 capsule    Refill:  3  . FLUoxetine (PROZAC) 20 MG capsule    Sig: Take 1 capsule (20 mg total) by mouth daily. Take in combination w/ 10 mg dose for TDD  30 mg    Dispense:  90 capsule    Refill:  3  . amphetamine-dextroamphetamine (ADDERALL) 20 MG tablet    Sig: Take 1 tablet (20 mg total) by mouth 2 (two) times daily.    Dispense:  180 tablet    Refill:  0   There are no Patient Instructions on file for this visit.  Instructions sent via MyChart.   Follow Up Instructions: Return in about 6 months (around 10/13/2020) for ANNUAL CHECK-UP - SEE [place] SOONER IF NEEDED, IN-OFFICE VISIT.    I discussed the assessment and treatment plan with the patient. The patient was provided an opportunity to ask questions and all were answered. The patient agreed with the plan and demonstrated an understanding of the instructions.   The patient was advised to call back or seek an in-person evaluation if any new concerns, if symptoms worsen  or if the condition fails to improve as anticipated.  20 minutes of non-face-to-face time was provided during this encounter.      . . . . . . . . . . . . . Marland Kitchen                   Historical information moved to improve visibility of documentation.  Past Medical History:  Diagnosis Date  . ADHD   . Hypertension    Past Surgical History:  Procedure Laterality Date  . ROTATOR CUFF REPAIR Right 2018  . thumb surg near amputation retached Right 03/29/2018   [facility]   Social History   Tobacco Use  . Smoking status: Never Smoker  . Smokeless tobacco: Never Used  Substance Use Topics  . Alcohol use: No   family history includes Cancer (age of onset: 19) in his father; Hypertension in his father.  Medications: Current Outpatient Medications  Medication Sig Dispense Refill  . FLUoxetine (PROZAC) 10 MG capsule Take 1 capsule (10 mg total) by mouth daily. Take in combination w/ 20 mg dose for TDD 30 mg 90 capsule 3  . FLUoxetine (PROZAC) 20 MG capsule Take 1 capsule (20 mg total) by mouth daily. Take in combination w/ 10 mg dose for TDD 30 mg 90 capsule 3  . ibuprofen (ADVIL) 200 MG tablet Take 3 tablets (600 mg total) by mouth every 6 (six) hours.    . tadalafil (CIALIS) 20 MG tablet Take 0.5-1 tablets (10-20 mg total) by mouth every other day as needed for erectile dysfunction. 30 tablet 11  . amphetamine-dextroamphetamine (ADDERALL) 20 MG tablet Take 1 tablet (20 mg total) by mouth 2 (two) times daily. 180 tablet 0   No current facility-administered medications for this visit.   No Known Allergies

## 2020-04-15 NOTE — Progress Notes (Signed)
Called pt at 230 pm, no answer. Left a detailed vm msg.

## 2020-04-25 IMAGING — DX LUMBAR SPINE - COMPLETE 4+ VIEW
5 series · 5 of 5 positions shown · non-contrast
Comparison: None.

CLINICAL DATA: 37-year-old male with mid to upper back pain. No
known injury.

EXAM:
LUMBAR SPINE - COMPLETE 4+ VIEW; THORACIC SPINE - 3 VIEWS

[l-spine ap]
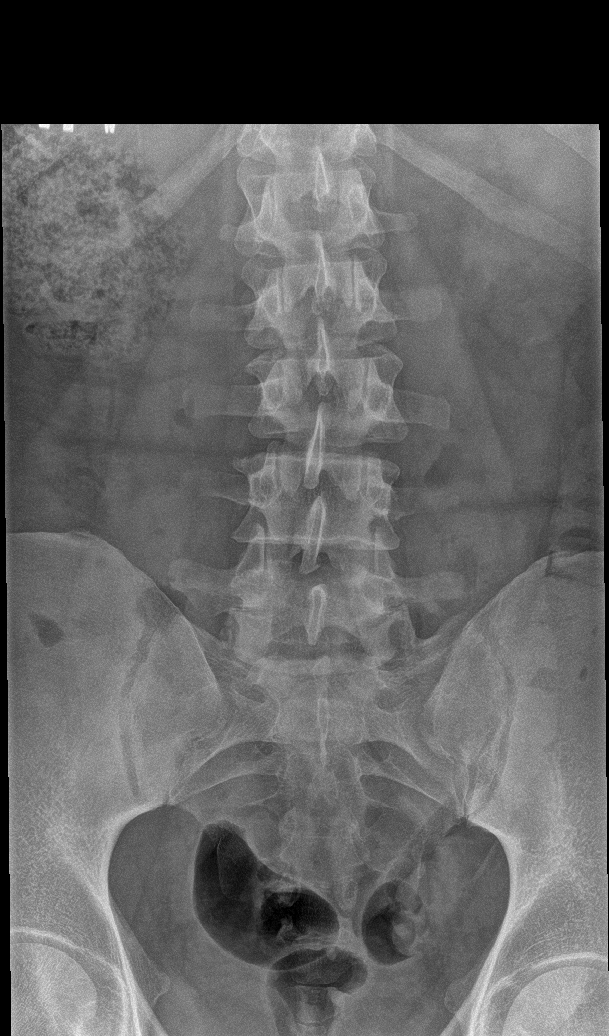

[l-spine obl (1 of 2)]
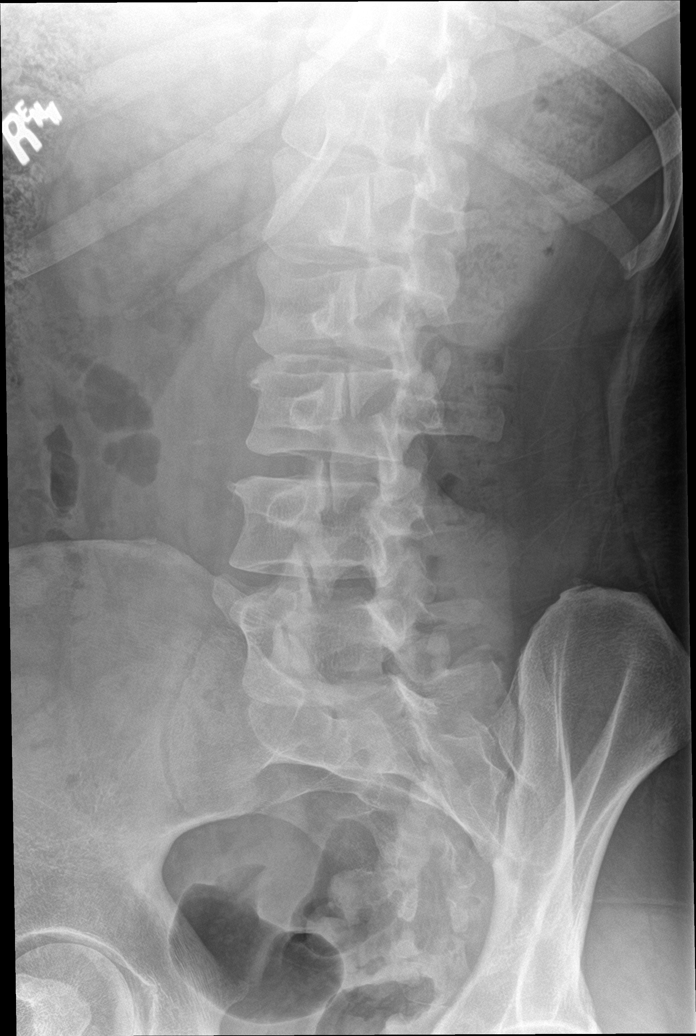

[l-spine obl (2 of 2)]
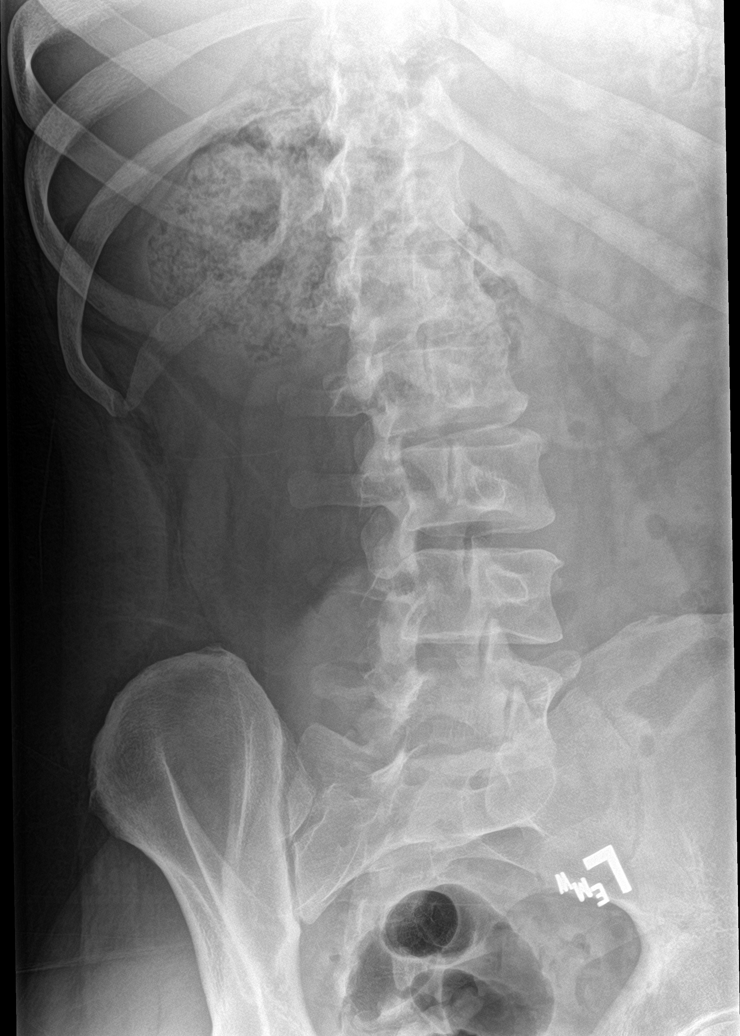

[l-spine lat]
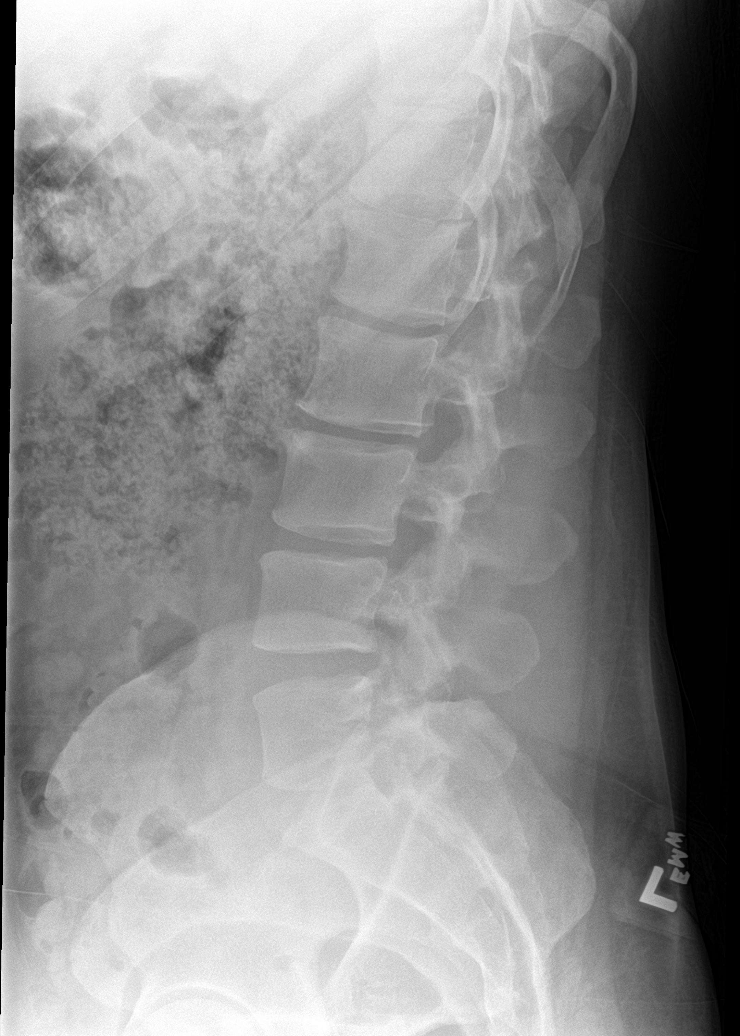

[l-spine spot]
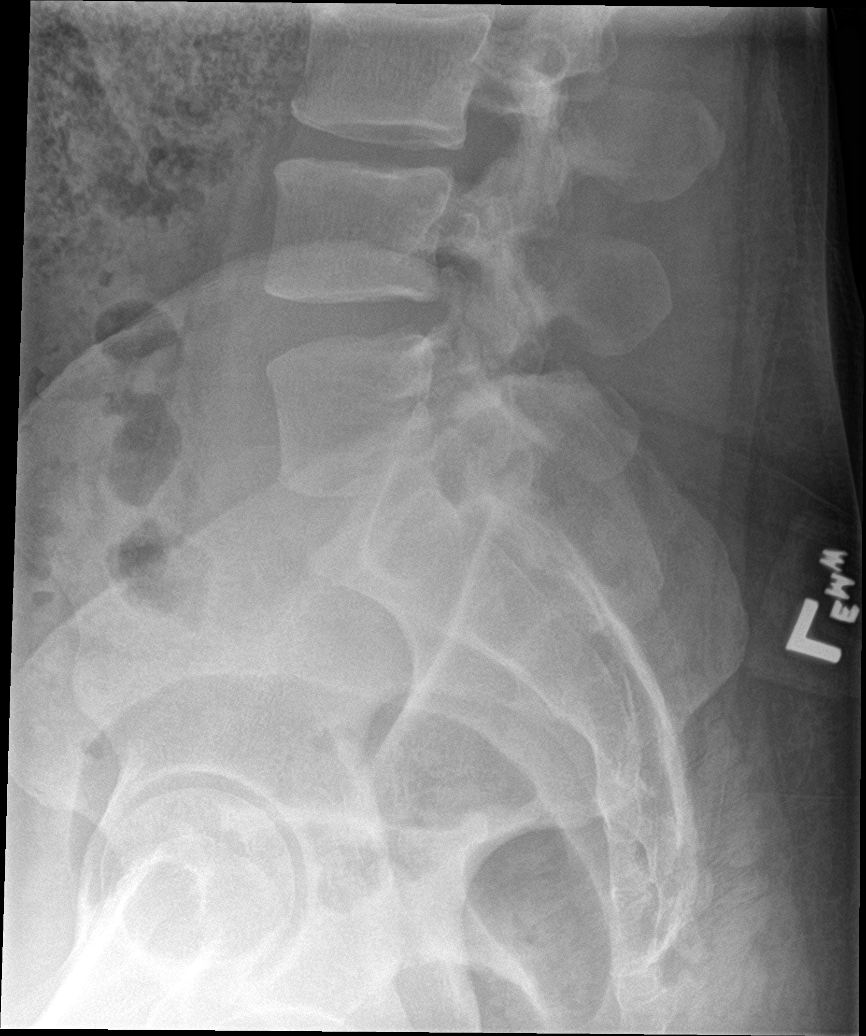

[5 of 5 positions shown; findings below may reference images not displayed]

FINDINGS: Mild dextroscoliosis of the midthoracic spine. There is no acute
fracture or subluxation of the thoracic or lumbar spine. The
visualized posterior elements are intact. Mild degenerative changes
at L2-L3. The soft tissues are unremarkable.
IMPRESSION: 1. No acute/traumatic thoracic or lumbar spine pathology.
2. Mild midthoracic dextroscoliosis.

## 2020-07-10 ENCOUNTER — Other Ambulatory Visit: Payer: Self-pay | Admitting: Osteopathic Medicine

## 2020-07-10 NOTE — Telephone Encounter (Signed)
Routing to covering provider.  °

## 2020-07-11 MED ORDER — AMPHETAMINE-DEXTROAMPHETAMINE 20 MG PO TABS: 20.0000 mg | ORAL_TABLET | Freq: Two times a day (BID) | ORAL | 0 refills | Status: DC

## 2020-07-16 ENCOUNTER — Telehealth: Payer: Self-pay | Admitting: *Deleted

## 2020-07-16 NOTE — Telephone Encounter (Signed)
Adderall PA submitted and approved through Cover My Meds.  Pharmacy notified.

## 2020-09-22 DIAGNOSIS — Z3141 Encounter for fertility testing: Secondary | ICD-10-CM | POA: Diagnosis not present

## 2020-10-28 ENCOUNTER — Other Ambulatory Visit: Payer: Self-pay | Admitting: Family Medicine

## 2020-10-28 MED ORDER — AMPHETAMINE-DEXTROAMPHETAMINE 20 MG PO TABS: 20.0000 mg | ORAL_TABLET | Freq: Two times a day (BID) | ORAL | 0 refills | Status: DC

## 2020-10-28 NOTE — Telephone Encounter (Signed)
Due for follow-up please schedule for annual Refills sent, no additional refills will be authorized w/o visit!

## 2020-10-29 NOTE — Telephone Encounter (Signed)
Appointment has been made no further questions at this time.  

## 2020-11-25 ENCOUNTER — Encounter: Payer: Federal, State, Local not specified - PPO | Admitting: Osteopathic Medicine

## 2020-12-03 ENCOUNTER — Telehealth: Payer: Self-pay | Admitting: Family Medicine

## 2020-12-03 NOTE — Telephone Encounter (Signed)
Patient is looking for a new primary are physician. Dr Denyse Amass recommended Dr Durene Cal due to their similar styles.  They contacted [facility] and were told that Dr Durene Cal is not accepting new patients at this time. Is there anyone else that you would recommend or would you be able to contact Dr Durene Cal to see if he would consider being his PCP?   Please advise.

## 2020-12-04 ENCOUNTER — Telehealth: Payer: Self-pay

## 2020-12-04 NOTE — Telephone Encounter (Signed)
LVM for patient to call back and schedule a new patient appt ok per [facility]. Let patient know in VM Dr. Durene Cal is booked out till January.

## 2020-12-04 NOTE — Telephone Encounter (Signed)
I will try

## 2020-12-26 ENCOUNTER — Encounter: Payer: Self-pay | Admitting: Family Medicine

## 2020-12-26 ENCOUNTER — Ambulatory Visit (INDEPENDENT_AMBULATORY_CARE_PROVIDER_SITE_OTHER): Payer: Federal, State, Local not specified - PPO | Admitting: Family Medicine

## 2020-12-26 ENCOUNTER — Other Ambulatory Visit: Payer: Self-pay

## 2020-12-26 VITALS — BP 149/89 | HR 111 | Ht 72.5 in | Wt 271.4 lb

## 2020-12-26 DIAGNOSIS — F4010 Social phobia, unspecified: Secondary | ICD-10-CM | POA: Diagnosis not present

## 2020-12-26 DIAGNOSIS — R5383 Other fatigue: Secondary | ICD-10-CM

## 2020-12-26 DIAGNOSIS — R6882 Decreased libido: Secondary | ICD-10-CM | POA: Diagnosis not present

## 2020-12-26 DIAGNOSIS — R21 Rash and other nonspecific skin eruption: Secondary | ICD-10-CM

## 2020-12-26 MED ORDER — ESCITALOPRAM OXALATE 5 MG PO TABS: ORAL_TABLET | ORAL | 6 refills | Status: DC

## 2020-12-26 MED ORDER — TADALAFIL 20 MG PO TABS: 20.0000 mg | ORAL_TABLET | Freq: Every day | ORAL | 6 refills | Status: DC | PRN

## 2020-12-26 NOTE — Progress Notes (Signed)
Office Visit Note   Patient: Brett Brown           Date of Birth: Sep 24, 1980           MRN: [id number] Visit Date: 12/26/2020 Requested by: Sunnie Nielsen, DO [address] [place],  [place] [address] PCP: Sunnie Nielsen, DO  Subjective: Chief Complaint  Patient presents with   Other    Testosterone levels (functional medicine)    HPI: He is here with several concerns.   For the past couple years he has had decreased libido.  He is not sure why.  His energy level has not been good either.  He would like to have his testosterone checked.  For most of his life he has struggled with social anxiety.  Paxil has helped, but does not seem to be working as well as it used to.  He is interested in switching to Lexapro.  He has had intermittent rash on his left posterior elbow.  He sleeps poorly.  He moved here from [place] a couple years ago with his wife.  He is a former Veterinary surgeon.  Family history was reviewed.                ROS:   All other systems were reviewed and are negative.  Objective: Vital Signs: BP (!) 149/89 (BP Location: Left Arm, Patient Position: Sitting, Cuff Size: Large)   Pulse (!) 111   Ht 6' 0.5" (1.842 m)   Wt 271 lb 6.4 oz (123.1 kg)   BMI 36.30 kg/m   Physical Exam:  General:  Alert and oriented, in no acute distress. Pulm:  Breathing unlabored. Psy:  Normal mood, congruent affect. Skin: There is dry skin on the posterior left elbow.  No nail deformities. Neck: No thyromegaly or nodules. CV: Regular rate and rhythm without murmurs, rubs, or gallops.  No peripheral edema.  2+ radial and posterior tibial pulses. Lungs: Clear to auscultation throughout with no wheezing or areas of consolidation. Extremities: 2+ DTRs.    Imaging: No results found.  Assessment & Plan: Fatigue and decreased libido, question testosterone deficiency -Labs to evaluate.  2.  Decreased libido - Cialis refilled.  3.  Social anxiety - Labs to  evaluate, switch to Lexapro.     Procedures: No procedures performed        PMFS History: Patient Active Problem List   Diagnosis Date Noted   ED (erectile dysfunction) 06/18/2019   GAD (generalized anxiety disorder) 10/05/2018   HTN (hypertension) 04/19/2018   ADHD 04/19/2018   Obese 04/19/2018   Past Medical History:  Diagnosis Date   ADHD    Hypertension     Family History  Problem Relation Age of Onset   Healthy Mother    Hypertension Father    Cancer Father 92       bladder cancer   Bladder Cancer Father    Healthy Sister    Healthy Brother    Thyroid disease Neg Hx     Past Surgical History:  Procedure Laterality Date   ROTATOR CUFF REPAIR Right 2018   thumb surg near amputation retached Right 03/29/2018   [facility]   Social History   Occupational History   Not on file  Tobacco Use   Smoking status: Never   Smokeless tobacco: Never  Vaping Use   Vaping Use: Never used  Substance and Sexual Activity   Alcohol use: No   Drug use: No   Sexual activity: Not on  file

## 2020-12-29 ENCOUNTER — Telehealth: Payer: Self-pay | Admitting: Family Medicine

## 2020-12-29 LAB — COMPREHENSIVE METABOLIC PANEL
AG Ratio: 1.9 (calc) (ref 1.0–2.5)
ALT: 35 U/L (ref 9–46)
AST: 17 U/L (ref 10–40)
Albumin: 5 g/dL (ref 3.6–5.1)
Alkaline phosphatase (APISO): 104 U/L (ref 36–130)
BUN: 17 mg/dL (ref 7–25)
CO2: 28 mmol/L (ref 20–32)
Calcium: 10.2 mg/dL (ref 8.6–10.3)
Chloride: 102 mmol/L (ref 98–110)
Creat: 1.17 mg/dL (ref 0.60–1.29)
Globulin: 2.6 g/dL (calc) (ref 1.9–3.7)
Glucose, Bld: 96 mg/dL (ref 65–99)
Potassium: 4.1 mmol/L (ref 3.5–5.3)
Sodium: 139 mmol/L (ref 135–146)
Total Bilirubin: 0.5 mg/dL (ref 0.2–1.2)
Total Protein: 7.6 g/dL (ref 6.1–8.1)

## 2020-12-29 LAB — CBC WITH DIFFERENTIAL/PLATELET
Absolute Monocytes: 690 cells/uL (ref 200–950)
Basophils Absolute: 83 cells/uL (ref 0–200)
Basophils Relative: 0.7 %
Eosinophils Absolute: 60 cells/uL (ref 15–500)
Eosinophils Relative: 0.5 %
HCT: 49.3 % (ref 38.5–50.0)
Hemoglobin: 16.2 g/dL (ref 13.2–17.1)
Lymphs Abs: 2618 cells/uL (ref 850–3900)
MCH: 29.8 pg (ref 27.0–33.0)
MCHC: 32.9 g/dL (ref 32.0–36.0)
MCV: 90.8 fL (ref 80.0–100.0)
MPV: 9.1 fL (ref 7.5–12.5)
Monocytes Relative: 5.8 %
Neutro Abs: 8449 cells/uL — ABNORMAL HIGH (ref 1500–7800)
Neutrophils Relative %: 71 %
Platelets: 381 10*3/uL (ref 140–400)
RBC: 5.43 10*6/uL (ref 4.20–5.80)
RDW: 13.5 % (ref 11.0–15.0)
Total Lymphocyte: 22 %
WBC: 11.9 10*3/uL — ABNORMAL HIGH (ref 3.8–10.8)

## 2020-12-29 LAB — THYROID PANEL WITH TSH
Free Thyroxine Index: 2.2 (ref 1.4–3.8)
T3 Uptake: 30 % (ref 22–35)
T4, Total: 7.3 ug/dL (ref 4.9–10.5)
TSH: 3.67 mIU/L (ref 0.40–4.50)

## 2020-12-29 LAB — TESTOSTERONE TOTAL,FREE,BIO, MALES
Albumin: 5 g/dL (ref 3.6–5.1)
Sex Hormone Binding: 17 nmol/L (ref 10–50)
Testosterone, Bioavailable: 112.5 ng/dL (ref 110.0–575.0)
Testosterone, Free: 49.5 pg/mL (ref 46.0–224.0)
Testosterone: 256 ng/dL (ref 250–827)

## 2020-12-29 LAB — LIPID PANEL
Cholesterol: 228 mg/dL — ABNORMAL HIGH (ref <200)
HDL: 57 mg/dL (ref >=40)
LDL Cholesterol (Calc): 139 mg/dL (calc) — ABNORMAL HIGH
Non-HDL Cholesterol (Calc): 171 mg/dL (calc) — ABNORMAL HIGH (ref <130)
Total CHOL/HDL Ratio: 4 (calc) (ref <5.0)
Triglycerides: 186 mg/dL — ABNORMAL HIGH (ref <150)

## 2020-12-29 LAB — COPPER, SERUM: Copper: 112 ug/dL (ref 70–175)

## 2020-12-29 LAB — PSA: PSA: 0.31 ng/mL (ref <=4.00)

## 2020-12-29 LAB — ZINC: Zinc: 71 ug/dL (ref 60–130)

## 2020-12-29 LAB — ANA: Anti Nuclear Antibody (ANA): NEGATIVE

## 2020-12-29 LAB — THYROID PEROXIDASE ANTIBODY: Thyroperoxidase Ab SerPl-aCnc: <1 IU/mL (ref <9)

## 2020-12-29 NOTE — Telephone Encounter (Signed)
Labs are notable for the following:  Lipid numbers are elevated.  In particular, triglycerides are more than doubled the HDL which increases risk of developing diabetes.  It is very important to exercise regularly and to minimize dietary intake of breads, pastas, cereals, sugars and sweets.  Losing weight to a BMI closer to 25 will also make a big difference and testosterone numbers.  Testosterone levels are low.  Treatment could be considered, but this needs to be started by whoever you plan to see long-term as your PCP.  It then needs to be closely monitored.  Copper level is significantly elevated relative to the zinc, with a ratio of 1.57.  We want the zinc level to be slightly higher than the copper level.  This can be associated with anxiety, hormone imbalance, and other symptoms.  I recommend taking zinc at 30 -50 mg daily for 3-4 months and then recheck the levels.  Also, minimize intake of copper-rich foods (https://draxe.com/nutrition/foods-high-in-copper/)  More info:  https://chriskresser.com/rhr-could-copper-zinc-imbalance-be-making-you-sick/  Other labs look fine.

## 2021-01-05 ENCOUNTER — Encounter: Payer: Self-pay | Admitting: Osteopathic Medicine

## 2021-01-05 ENCOUNTER — Ambulatory Visit: Payer: Federal, State, Local not specified - PPO | Admitting: Osteopathic Medicine

## 2021-01-05 ENCOUNTER — Other Ambulatory Visit: Payer: Self-pay

## 2021-01-05 VITALS — BP 131/79 | HR 73 | Temp 98.2°F | Wt 276.0 lb

## 2021-01-05 DIAGNOSIS — F411 Generalized anxiety disorder: Secondary | ICD-10-CM

## 2021-01-05 DIAGNOSIS — F902 Attention-deficit hyperactivity disorder, combined type: Secondary | ICD-10-CM | POA: Diagnosis not present

## 2021-01-05 DIAGNOSIS — R6882 Decreased libido: Secondary | ICD-10-CM

## 2021-01-05 DIAGNOSIS — G4709 Other insomnia: Secondary | ICD-10-CM

## 2021-01-05 DIAGNOSIS — E291 Testicular hypofunction: Secondary | ICD-10-CM | POA: Diagnosis not present

## 2021-01-05 DIAGNOSIS — I10 Essential (primary) hypertension: Secondary | ICD-10-CM

## 2021-01-05 NOTE — Patient Instructions (Addendum)
Testosterone deficiency - standard of medical practice is to get at least TWO abnormally low testosterone values and to also check other hormone levels and labs to rule out fixable and/or serious causes of low T (iron storage disease, liver disease, other hormone imbalance, pituitary brain tumor). I have ordered additional testing to cover these things. If low testosterone is confirmed, and no concerning cause for low T is found, we can trial testosterone therapy. Will also send referral for home sleep study.          [facility] urology (11th ed., pp. [phone]). [place], [place]: Elsevier.">  Testosterone Replacement Therapy Testosterone replacement therapy (TRT) treats men who have a low testosterone level. Testosterone is a male hormone that is produced in the testicles. It is responsible for typical male characteristics and for maintaining a man's sex drive and ability to get an erection. Testosterone also supports bone andmuscle health. Low testosterone may not need to be treated. Your health care provider may recommend TRT if you have symptoms such as a low sex drive or erectionproblems, weak muscles or bones, or low energy. Types of TRT  You and your health care provider will decide which form is best for you. TRTis available in the following forms: Topical gels, creams, lotions, or sprays. Do not let other people, especially women or children, come in contact with the skin where the testosterone was applied. Patches. Pills. Injections into the muscle or under the skin. Long-acting pellets inserted under the skin (urology specialist does this)  The amount of TRT you take and how long you take it is based on your condition. It is important to: Begin TRT with the lowest possible dosage. Stop TRT if your health care provider tells you to stop. Work with your health care provider so that you feel informed and comfortable with your decision. Tell a health care provider about: Any  allergies you have. Any personal or family history of blood clots, breast cancer, or prostate cancer. If you have sleep apnea or have been told that you snore. All medicines you are taking, including vitamins, herbs, eye drops, creams, and over-the-counter medicines. Any surgeries you have had. Any other medical conditions you have. If you wish to have biological children someday. What are the benefits? Benefits of TRT vary but may include improved sexual function, muscle mass orstrength, mood, or quality of life. What are the risks? Risks of TRT vary depending on your individual and medical history. Side effects can be related to the type of TRT you choose. If you choose products that are used on the skin, you may have skin irritation. If you get injections,you may have redness and swelling at the injection site. Side effects that can happen with any form of TRT include: Lower sperm count. Acne. Swelling of your legs or feet, or tenderness in the chest or breast area. Sleep disturbances and mood swings. Enlarged prostate. Increase in your red blood count. It is unclear if testosterone increases the risk of some serious conditions. Talk with your health care provider about your risk for these conditions, including: Blood clots. Heart disease, such as stroke or heart attack. Prostate cancer. Risks of TRT may increase if you: Have had or are at risk for prostate cancer or breast cancer. Have had a previous stroke or heart attack. Have a high number of red blood cells. Have treated or untreated sleep apnea. Have a large prostate. The long-term safety of TRT is not known. Follow these instructions at home: Take over-the-counter and  prescription medicines only as told by your health care provider. Lose weight if you are overweight. Ask your health care provider to help you start a healthy diet and exercise program to reach and maintain a healthy weight. Work with your health care provider  to manage other medical conditions that may lower your testosterone. These include obesity, high blood pressure, high cholesterol, diabetes, liver disease, kidney disease, and sleep apnea. Do not use any testosterone replacement therapies that are not prescribed by your health care provider. Keep all follow-up visits. This is important. Where to find more information American Urological Foundation: www.urologyhealth.org Endocrine Society: www.hormone.org Contact a health care provider if: You have side effects from your TRT. You have pain or swelling in your legs. You have problems urinating. You have lumps or changes in your breasts or armpits. Get help right away if: You have shortness of breath. You have chest pain. You have slurred speech. You have weakness or numbness in any part of your arms or legs. These symptoms may represent a serious problem that is an emergency. Do not wait to see if the symptoms will go away. Get medical help right away. Call your local emergency services (911 in the [place] Do not drive yourself to the hospital. Summary Testosterone replacement therapy (TRT) is used to treat men who have a low testosterone level. TRT should only be prescribed by and under the supervision of a health care provider. Tell a health care provider about any medical conditions you have. TRT may have side effects. Talk with your health care provider about all of the risks and benefits before you start therapy. This information is not intended to replace advice given to you by your health care provider. Make sure you discuss any questions you have with your healthcare provider. Document Revised: 02/26/2020 Document Reviewed: 02/26/2020 Elsevier Patient Education  2022 [employer].

## 2021-01-05 NOTE — Progress Notes (Signed)
Brett Brown is a 40 y.o. male who presents to  [facility] at [facility]  today, 01/05/21, seeking care for the following:  Reviewed notes from Dr Prince Rome (functional / integrative medicine) 12/26/20 and lab result discussion 12/29/20. Dr. Prince Rome recommended testosterone, but he will be changing practices and no longer working directly w/ insurance. Patient opts to come see me for testosterone ongoing care d/t insurance coverage.  Labs reviewed: one level low T 12/26/20, total T 256 ng/dL, PSA WNL Discussion today w/ patient - he is experiencing a lot of tiredness, low energy, less endurance w/ physical activity than he used to have. Reports longstanding poor sleep and stress.      ASSESSMENT & PLAN with other pertinent findings:  The primary encounter diagnosis was Low libido. Diagnoses of Testosterone deficiency in male, GAD (generalized anxiety disorder), Attention deficit hyperactivity disorder (ADHD), combined type, Primary hypertension, and Other insomnia were also pertinent to this visit.   Labs as below If all normal, will start testosterone replacement w/  gel/cream per patient preference, he and I discussed other options if desired Would still eval for OSA Consider adjust Lexapro but await T labs and HST first  Patient Instructions  Testosterone deficiency - standard of medical practice is to get at least TWO abnormally low testosterone values and to also check other hormone levels and labs to rule out fixable and/or serious causes of low T (iron storage disease, liver disease, other hormone imbalance, pituitary brain tumor). I have ordered additional testing to cover these things. If low testosterone is confirmed, and no concerning cause for low T is found, we can trial testosterone therapy. Will also send referral for home sleep study.          [facility]h urology (11th ed., pp. [phone]). [place], [place]: Elsevier.">   Testosterone Replacement Therapy Testosterone replacement therapy (TRT) treats men who have a low testosterone level. Testosterone is a male hormone that is produced in the testicles. It is responsible for typical male characteristics and for maintaining a man's sex drive and ability to get an erection. Testosterone also supports bone andmuscle health. Low testosterone may not need to be treated. Your health care provider may recommend TRT if you have symptoms such as a low sex drive or erectionproblems, weak muscles or bones, or low energy. Types of TRT  You and your health care provider will decide which form is best for you. TRTis available in the following forms: Topical gels, creams, lotions, or sprays. Do not let other people, especially women or children, come in contact with the skin where the testosterone was applied. Patches. Pills. Injections into the muscle or under the skin. Long-acting pellets inserted under the skin (urology specialist does this)  The amount of TRT you take and how long you take it is based on your condition. It is important to: Begin TRT with the lowest possible dosage. Stop TRT if your health care provider tells you to stop. Work with your health care provider so that you feel informed and comfortable with your decision. Tell a health care provider about: Any allergies you have. Any personal or family history of blood clots, breast cancer, or prostate cancer. If you have sleep apnea or have been told that you snore. All medicines you are taking, including vitamins, herbs, eye drops, creams, and over-the-counter medicines. Any surgeries you have had. Any other medical conditions you have. If you wish to have biological children someday. What are the benefits?  Benefits of TRT vary but may include improved sexual function, muscle mass orstrength, mood, or quality of life. What are the risks? Risks of TRT vary depending on your individual and medical history.  Side effects can be related to the type of TRT you choose. If you choose products that are used on the skin, you may have skin irritation. If you get injections,you may have redness and swelling at the injection site. Side effects that can happen with any form of TRT include: Lower sperm count. Acne. Swelling of your legs or feet, or tenderness in the chest or breast area. Sleep disturbances and mood swings. Enlarged prostate. Increase in your red blood count. It is unclear if testosterone increases the risk of some serious conditions. Talk with your health care provider about your risk for these conditions, including: Blood clots. Heart disease, such as stroke or heart attack. Prostate cancer. Risks of TRT may increase if you: Have had or are at risk for prostate cancer or breast cancer. Have had a previous stroke or heart attack. Have a high number of red blood cells. Have treated or untreated sleep apnea. Have a large prostate. The long-term safety of TRT is not known. Follow these instructions at home: Take over-the-counter and prescription medicines only as told by your health care provider. Lose weight if you are overweight. Ask your health care provider to help you start a healthy diet and exercise program to reach and maintain a healthy weight. Work with your health care provider to manage other medical conditions that may lower your testosterone. These include obesity, high blood pressure, high cholesterol, diabetes, liver disease, kidney disease, and sleep apnea. Do not use any testosterone replacement therapies that are not prescribed by your health care provider. Keep all follow-up visits. This is important. Where to find more information American Urological Foundation: www.urologyhealth.org Endocrine Society: www.hormone.org Contact a health care provider if: You have side effects from your TRT. You have pain or swelling in your legs. You have problems urinating. You have  lumps or changes in your breasts or armpits. Get help right away if: You have shortness of breath. You have chest pain. You have slurred speech. You have weakness or numbness in any part of your arms or legs. These symptoms may represent a serious problem that is an emergency. Do not wait to see if the symptoms will go away. Get medical help right away. Call your local emergency services (911 in the [place] Do not drive yourself to the hospital. Summary Testosterone replacement therapy (TRT) is used to treat men who have a low testosterone level. TRT should only be prescribed by and under the supervision of a health care provider. Tell a health care provider about any medical conditions you have. TRT may have side effects. Talk with your health care provider about all of the risks and benefits before you start therapy. This information is not intended to replace advice given to you by your health care provider. Make sure you discuss any questions you have with your healthcare provider. Document Revised: 02/26/2020 Document Reviewed: 02/26/2020 Elsevier Patient Education  2022 Elsevier Inc.  Orders Placed This Encounter  Procedures   Testosterone Total,Free,Bio, Males   Prolactin   FSH/LH   Fe+TIBC+Fer   Cortisol-am, blood   Home sleep test    No orders of the defined types were placed in this encounter.    See below for relevant physical exam findings  See below for recent lab and imaging results reviewed  Medications,  allergies, PMH, PSH, SocH, FamH reviewed below    Follow-up instructions: Return for LAB VISIT ONLY - FASTING, IDEALLY 7:00-8:00 AM, ABSOLUTELY NO LATER THAN 8:30 AM!!!.                                        Exam:  BP 131/79 (BP Location: Left Arm, Patient Position: Sitting, Cuff Size: Large)   Pulse 73   Temp 98.2 F (36.8 C) (Oral)   Wt 276 lb (125.2 kg)   BMI 36.92 kg/m  Constitutional: VS see above. General  Appearance: alert, well-developed, well-nourished, NAD Neck: No masses, trachea midline.  Respiratory: Normal respiratory effort.  Musculoskeletal: Gait normal. Symmetric and independent movement of all extremities Neurological: Normal balance/coordination. No tremor. Skin: warm, dry, intact.  Psychiatric: Normal judgment/insight. Normal mood and affect. Oriented x3.   Current Meds  Medication Sig   amphetamine-dextroamphetamine (ADDERALL) 20 MG tablet Take 1 tablet (20 mg total) by mouth 2 (two) times daily.   escitalopram (LEXAPRO) 5 MG tablet 1 PO qd, may increase to 2 PO qd if needed   ibuprofen (ADVIL) 200 MG tablet Take 3 tablets (600 mg total) by mouth every 6 (six) hours.   tadalafil (CIALIS) 20 MG tablet Take 1 tablet (20 mg total) by mouth daily as needed for erectile dysfunction.    No Known Allergies  Patient Active Problem List   Diagnosis Date Noted   Testosterone deficiency in male 01/05/2021   ED (erectile dysfunction) 06/18/2019   GAD (generalized anxiety disorder) 10/05/2018   HTN (hypertension) 04/19/2018   ADHD 04/19/2018   Obese 04/19/2018    Family History  Problem Relation Age of Onset   Healthy Mother    Hypertension Father    Cancer Father 106       bladder cancer   Bladder Cancer Father    Healthy Sister    Healthy Brother    Thyroid disease Neg Hx     Social History   Tobacco Use  Smoking Status Never  Smokeless Tobacco Never    Past Surgical History:  Procedure Laterality Date   ROTATOR CUFF REPAIR Right 2018   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 A negative ANA IFA result suggests an ANA-associated autoimmune disease is not present at this time, but is not definitive. If there is high clinical suspicion for Sjogren's syndrome, testing for anti-SS-A/Ro antibody should be considered. Anti-Jo-1 antibody should be considered for clinically suspected inflammatory myopathies. . AC-0: Negative . International Consensus on ANA Patterns (SeverTies.uy) . For additional information, please refer to http://education.[facility].com/faq/FAQ177 (This link is being provided for informational/ educational purposes only.) .   Copper, serum     Status: None   Collection Time: 12/26/20  1:47 PM  Result Value Ref Range   Copper 112 70 - 175 mcg/dL    Comment: . This test was developed and its analytical performance characteristics have been determined by [facility] [place]. It has not been cleared or approved by the U.S. Food and Drug Administration. This assay has been validated pursuant to the CLIA regulations and is used for clinical purposes. .   Zinc     Status: None   Collection Time: 12/26/20  1:47 PM  Result Value Ref Range   Zinc 71 60 - 130 mcg/dL    Comment: . This test was developed and its analytical performance characteristics have been determined by [facility] [place]. It has not been cleared or approved by the U.S. Food and Drug Administration. This assay has been validated pursuant to the CLIA regulations and is used for clinical purposes. .   Thyroid Panel With TSH     Status: None   Collection Time: 12/26/20  1:47 PM  Result Value Ref Range   T3 Uptake 30 22 - 35 %   T4, Total 7.3 4.9 - 10.5 mcg/dL   Free Thyroxine Index 2.2 1.4 - 3.8   TSH 3.67 0.40 - 4.50 mIU/L  Thyroid peroxidase antibody     Status: None   Collection Time: 12/26/20  1:47 PM   Result Value Ref Range   Thyroperoxidase Ab SerPl-aCnc <1 <9 IU/mL  Testosterone Total,Free,Bio, Males     Status: None   Collection Time: 12/26/20  1:47 PM  Result Value Ref Range   Testosterone 256 250 - 827 ng/dL   Albumin 5.0 3.6 - 5.1 g/dL   Sex Hormone Binding 17 10 - 50 nmol/L   Testosterone, Free 49.5 46.0 - 224.0 pg/mL   Testosterone, Bioavailable 112.5 110.0 - 575.0 ng/dL  Comprehensive metabolic panel     Status: None   Collection Time: 12/26/20  1:47 PM  Result Value Ref Range   Glucose, Bld 96 65 - 99 mg/dL    Comment: .            Fasting reference interval .    BUN 17 7 - 25 mg/dL   Creat 4.401.17 3.470.60 - 4.251.29 mg/dL   BUN/Creatinine Ratio NOT APPLICABLE 6 - 22 (calc)   Sodium 139 135 - 146 mmol/L   Potassium 4.1 3.5 - 5.3 mmol/L   Chloride 102 98 - 110 mmol/L   CO2 28 20 - 32 mmol/L   Calcium 10.2 8.6 - 10.3 mg/dL   Total Protein 7.6 6.1 - 8.1 g/dL   Albumin 5.0 3.6 - 5.1 g/dL   Globulin 2.6 1.9 - 3.7 g/dL (calc)   AG Ratio 1.9 1.0 - 2.5 (calc)   Total Bilirubin 0.5 0.2 - 1.2 mg/dL   Alkaline phosphatase (APISO) 104 36 - 130 U/L   AST 17 10 - 40 U/L   ALT 35 9 - 46 U/L  CBC with Differential/Platelet     Status: Abnormal   Collection Time: 12/26/20  1:47 PM  Result Value Ref Range   WBC 11.9 (H) 3.8 - 10.8 Thousand/uL   RBC 5.43 4.20 - 5.80 Million/uL   Hemoglobin 16.2 13.2 - 17.1 g/dL   HCT 95.649.3 38.738.5 - 56.450.0 %   MCV 90.8 80.0 - 100.0 fL   MCH 29.8 27.0 - 33.0 pg   MCHC 32.9 32.0 - 36.0 g/dL   RDW 33.213.5 95.111.0 - 88.415.0 %   Platelets 381 140 - 400 Thousand/uL   MPV 9.1 7.5 - 12.5 fL   Neutro Abs 8,449 (H) 1,500 - 7,800 cells/uL   Lymphs Abs 2,618 850 - 3,900 cells/uL   Absolute Monocytes 690 200 - 950 cells/uL  Eosinophils Absolute 60 15 - 500 cells/uL   Basophils Absolute 83 0 - 200 cells/uL   Neutrophils Relative % 71 %   Total Lymphocyte 22.0 %   Monocytes Relative 5.8 %   Eosinophils Relative 0.5 %   Basophils Relative 0.7 %  PSA     Status: None    Collection Time: 12/26/20  1:47 PM  Result Value Ref Range   PSA 0.31 < OR = 4.00 ng/mL    Comment: The total PSA value from this assay system is  standardized against the WHO standard. The test  result will be approximately 20% lower when compared  to the equimolar-standardized total PSA (Beckman  Coulter). Comparison of serial PSA results should be  interpreted with this fact in mind. . This test was performed using the Siemens  chemiluminescent method. Values obtained from  different assay methods cannot be used interchangeably. PSA levels, regardless of value, should not be interpreted as absolute evidence of the presence or absence of disease.   Lipid panel     Status: Abnormal   Collection Time: 12/26/20  1:47 PM  Result Value Ref Range   Cholesterol 228 (H) <200 mg/dL   HDL 57 > OR = 40 mg/dL   Triglycerides 161 (H) <150 mg/dL   LDL Cholesterol (Calc) 139 (H) mg/dL (calc)    Comment: Reference range: <100 . Desirable range <100 mg/dL for primary prevention;   <70 mg/dL for patients with CHD or diabetic patients  with > or = 2 CHD risk factors. Marland Kitchen LDL-C is now calculated using the Martin-Hopkins  calculation, which is a validated novel method providing  better accuracy than the Friedewald equation in the  estimation of LDL-C.  Horald Pollen et al. Lenox Ahr. 0960;454(09): 2061-2068  (http://education.[facility].com/faq/FAQ164)    Total CHOL/HDL Ratio 4.0 <5.0 (calc)   Non-HDL Cholesterol (Calc) 171 (H) <130 mg/dL (calc)    Comment: For patients with diabetes plus 1 major ASCVD risk  factor, treating to a non-HDL-C goal of <100 mg/dL  (LDL-C of <81 mg/dL) is considered a therapeutic  option.     No results found.     All questions at time of visit were answered - patient instructed to contact office with any additional concerns or updates. ER/RTC precautions were reviewed with the patient as applicable.   Please note: manual typing as well as voice recognition  software may have been used to produce this document - typos may escape review. Please contact Dr. Lyn Hollingshead for any needed clarifications.   Total encounter time on date of service, 01/05/21, was 30 minutes spent addressing problems/issues as noted above in Assessment & Plan, including time spent in discussion with patient regarding the HPI, ROS, confirming history, reviewing Assessment & Plan, as well as time spent on coordination of care, record review.

## 2021-01-09 ENCOUNTER — Telehealth: Payer: Self-pay | Admitting: Osteopathic Medicine

## 2021-01-09 DIAGNOSIS — R5382 Chronic fatigue, unspecified: Secondary | ICD-10-CM

## 2021-01-09 DIAGNOSIS — Z6834 Body mass index (BMI) 34.0-34.9, adult: Secondary | ICD-10-CM

## 2021-01-09 DIAGNOSIS — I10 Essential (primary) hypertension: Secondary | ICD-10-CM

## 2021-01-09 DIAGNOSIS — G4709 Other insomnia: Secondary | ICD-10-CM

## 2021-01-09 NOTE — Telephone Encounter (Signed)
Sleep study orders

## 2021-01-21 ENCOUNTER — Other Ambulatory Visit: Payer: Self-pay | Admitting: Orthopaedic Surgery

## 2021-01-21 ENCOUNTER — Telehealth: Payer: Self-pay

## 2021-01-21 MED ORDER — ESCITALOPRAM OXALATE 5 MG PO TABS
ORAL_TABLET | ORAL | 6 refills | Status: DC
Start: 2021-01-21 — End: 2021-05-14

## 2021-01-21 NOTE — Telephone Encounter (Signed)
Refill request for Escitalopram 5mg  ([place] patient) [facility] 

## 2021-02-18 ENCOUNTER — Other Ambulatory Visit: Payer: Self-pay

## 2021-02-18 ENCOUNTER — Emergency Department
Admission: RE | Admit: 2021-02-18 | Discharge: 2021-02-18 | Disposition: A | Discharge status: Home / Self Care | Payer: Federal, State, Local not specified - PPO | Source: Ambulatory Visit | Attending: Family Medicine | Admitting: Family Medicine

## 2021-02-18 ENCOUNTER — Emergency Department (INDEPENDENT_AMBULATORY_CARE_PROVIDER_SITE_OTHER): Payer: Federal, State, Local not specified - PPO

## 2021-02-18 ENCOUNTER — Telehealth: Payer: Self-pay | Admitting: *Deleted

## 2021-02-18 VITALS — BP 142/92 | HR 85 | Temp 98.2°F | Resp 18 | Ht 73.0 in | Wt 260.0 lb

## 2021-02-18 DIAGNOSIS — M25462 Effusion, left knee: Secondary | ICD-10-CM | POA: Diagnosis not present

## 2021-02-18 DIAGNOSIS — M25562 Pain in left knee: Secondary | ICD-10-CM

## 2021-02-18 DIAGNOSIS — M25569 Pain in unspecified knee: Secondary | ICD-10-CM

## 2021-02-18 DIAGNOSIS — M2392 Unspecified internal derangement of left knee: Secondary | ICD-10-CM

## 2021-02-18 MED ORDER — METHYLPREDNISOLONE 4 MG PO TBPK: ORAL_TABLET | ORAL | 0 refills | Status: DC

## 2021-02-18 NOTE — Discharge Instructions (Signed)
Left knee was painful Use ice for 20 minutes every couple of hours to take down pain and inflammation take the prednisone as directed.  This is the Medrol Dosepak.  Take all of day 1 today If this completely resolves your pain and swelling, great.  Otherwise you may need to see Dr. Denyse Amass

## 2021-02-18 NOTE — ED Provider Notes (Signed)
[facility]    CSN: [id number] Arrival date & time: 02/18/21  1000      History   Chief Complaint Chief Complaint  Patient presents with   Knee Pain    HPI Brett Brown is a 40 y.o. male.   HPI Patient is here for left knee pain.  Its been bothering him for the last 3 days.  No injury.  No fall.  No trauma.  No overuse.  He states he did have an injury about 8 years ago to one of his knees and thinks it was this one.  He states that occasionally when he bends and straightens the knee he feels like it is locking or clicking.  He thinks something may be loose inside his knee.  He has been rubbing tiger balm on it, using ice, and ibuprofen  Past Medical History:  Diagnosis Date   ADHD    Hypertension     Patient Active Problem List   Diagnosis Date Noted   Testosterone deficiency in male 01/05/2021   ED (erectile dysfunction) 06/18/2019   GAD (generalized anxiety disorder) 10/05/2018   HTN (hypertension) 04/19/2018   ADHD 04/19/2018   Obese 04/19/2018    Past Surgical History:  Procedure Laterality Date   ROTATOR CUFF REPAIR Right 2018   thumb surg near amputation retached Right 03/29/2018   [facility]       Home Medications    Prior to Admission medications   Medication Sig Start Date End Date Taking? Authorizing Provider  amphetamine-dextroamphetamine (ADDERALL) 20 MG tablet Take 1 tablet (20 mg total) by mouth 2 (two) times daily. 10/28/20 02/18/21 Yes Brett Nielsen, DO  escitalopram (LEXAPRO) 5 MG tablet 1 PO qd, may increase to 2 PO qd if needed 01/21/21  Yes Brett Hitch, Brown  ibuprofen (ADVIL) 200 MG tablet Take 3 tablets (600 mg total) by mouth every 6 (six) hours. 03/29/18  Yes Brett Hook, Brown  methylPREDNISolone (MEDROL DOSEPAK) 4 MG TBPK tablet tad 02/18/21  Yes Brett Moore, Brown  tadalafil (CIALIS) 20 MG tablet Take 1 tablet (20 mg total) by mouth daily as needed for erectile dysfunction. 12/26/20  Yes  Brett Brown, Brett Needle, Brown    Family History Family History  Problem Relation Age of Onset   Healthy Mother    Hypertension Father    Cancer Father 39       bladder cancer   Bladder Cancer Father    Healthy Sister    Healthy Brother    Thyroid disease Neg Hx     Social History Social History   Tobacco Use   Smoking status: Never   Smokeless tobacco: Never  Vaping Use   Vaping Use: Never used  Substance Use Topics   Alcohol use: No   Drug use: No     Allergies   Patient has no known allergies.   Review of Systems Review of Systems  See HPI Physical Exam Triage Vital Signs ED Triage Vitals  Enc Vitals Group     BP 02/18/21 1015 (!) 142/92     Pulse Rate 02/18/21 1015 85     Resp 02/18/21 1015 18     Temp 02/18/21 1015 98.2 F (36.8 C)     Temp Source 02/18/21 1015 Oral     SpO2 02/18/21 1015 98 %     Weight 02/18/21 1013 260 lb (117.9 kg)     Height 02/18/21 1013 6\' 1"  (1.854 m)     Head Circumference --  Peak Flow --      Pain Score 02/18/21 1012 4     Pain Loc --      Pain Edu? --      Excl. in GC? --    No data found.  Updated Vital Signs BP (!) 142/92 (BP Location: Right Arm)   Pulse 85   Temp 98.2 F (36.8 C) (Oral)   Resp 18   Ht 6\' 1"  (1.854 m)   Wt 117.9 kg   SpO2 98%   BMI 34.30 kg/m       Physical Exam Constitutional:      General: He is not in acute distress.    Appearance: He is well-developed.  HENT:     Head: Normocephalic and atraumatic.  Eyes:     Conjunctiva/sclera: Conjunctivae normal.     Pupils: Pupils are equal, round, and reactive to light.  Cardiovascular:     Rate and Rhythm: Normal rate.  Pulmonary:     Effort: Pulmonary effort is normal. No respiratory distress.  Abdominal:     General: There is no distension.     Palpations: Abdomen is soft.  Musculoskeletal:        General: Normal range of motion.     Cervical back: Normal range of motion.     Comments: Right knee has full extension.  Flexion lacks  about 10 degrees.  There is effusion.  Tenderness over the medial and lateral joint lines.  No instability  Skin:    General: Skin is warm and dry.  Neurological:     Mental Status: He is alert.     Gait: Gait abnormal.     UC Treatments / Results  Labs (all labs ordered are listed, but only abnormal results are displayed) Labs Reviewed - No data to display  EKG   Radiology DG Knee AP/LAT W/Sunrise Left  Result Date: 02/18/2021 CLINICAL DATA:  Pain EXAM: LEFT KNEE 3 VIEWS COMPARISON:  None. FINDINGS: No evidence of acute fracture or dislocation. Small joint effusion. No evidence of arthropathy or other focal bone abnormality. Soft tissues are unremarkable. IMPRESSION: No acute osseous abnormality. Small joint effusion. Electronically Signed   By: 04/20/2021 M.D.   On: 02/18/2021 11:40    Procedures Procedures (including critical care time)  Medications Ordered in UC Medications - No data to display  Initial Impression / Assessment and Plan / UC Course  I have reviewed the triage vital signs and the nursing notes.  Pertinent labs & imaging results that were available during my care of the patient were reviewed by me and considered in my medical decision making (see chart for details).     Symptoms are suggestive of internal derangement.  We will treat conservatively with steroids ice and rest.  If he fails to improve he needs to see sports medicine for follow-up Final Clinical Impressions(s) / UC Diagnoses   Final diagnoses:  Acute pain of left knee  Internal derangement of left knee     Discharge Instructions      Left knee was painful Use ice for 20 minutes every couple of hours to take down pain and inflammation take the prednisone as directed.  This is the Medrol Dosepak.  Take all of day 1 today If this completely resolves your pain and swelling, great.  Otherwise you may need to see Dr. 04/20/2021    ED Prescriptions     Medication Sig Dispense Auth.  Provider   methylPREDNISolone (MEDROL DOSEPAK) 4 MG TBPK tablet tad  21 tablet Brett Moore, Brown      PDMP not reviewed this encounter.   Brett Moore, Brown 02/18/21 [phone]

## 2021-02-18 NOTE — Telephone Encounter (Signed)
No answer, LMOM x-ay negative. Continue with current treatment plan. Call back as needed.

## 2021-02-18 NOTE — ED Triage Notes (Signed)
Pt c/o LT knee pain x 3 days. Denies known injury. Taking IBF, ASA, ice and elvated, and tiger balm. Notes bruising and swelling.

## 2021-02-25 NOTE — Progress Notes (Signed)
I, Philbert Riser, LAT, ATC acting as a scribe for Clementeen Graham, MD.  Subjective:    CC: L knee pain  HPI: Pt is a 40 y/o male c/o L knee pain. Pt was previously seen by Dr. Denyse Amass on 01/10/19 for primary care. Of note, pt was see at the [facility] on 02/18/21 and was prescribed a Medrol Dosepak. Today, pt report L knee pain ongoing since the beginning of Oct w/ no MOI. Pt locates pain to medial, lateral, and anterior aspects of his L knee. Pt notes pain is like a "ache"  and had some bruising along the medial and lateral aspects.  L knee swelling: yes Mechanical symptoms: yes Aggravates: stairs, rotation/pivot Treatments tried: tiger balm, ice, IBU  Dx imaging: 02/18/21 L knee XR  Pertinent review of Systems: No fevers or chills  Relevant historical information: Hypertension, and ADHD.   Objective:    Vitals:   02/26/21 1315  BP: (!) 174/128  Pulse: 97  SpO2: 98%   General: Well Developed, well nourished, and in no acute distress.   MSK: Left knee: Minimal effusion. Normal motion. Tender palpation medial and lateral joint line. Mildly tender palpation anterior knee at proximal patellar tendon. Stable ligamentous exam. Intact strength some pain with resisted knee extension. Mildly positive McMurray's test.  Lab and Radiology Results  Procedure: Real-time Ultrasound Guided Injection of left knee superior lateral patellar space Device: Philips Affiniti 50G Images permanently stored and available for review in PACS Ultrasound evaluation prior to injection reveals mild joint effusion. Intact quad tendon.  Intact patellar tendon.  Trace hypoechoic change mid substance patellar tendon consistent with mild patellar tendinitis. Normal-appearing medial and lateral menisci.  No Baker's cyst. Verbal informed consent obtained.  Discussed risks and benefits of procedure. Warned about infection bleeding damage to structures skin hypopigmentation and fat atrophy among  others. Patient expresses understanding and agreement Time-out conducted.   Noted no overlying erythema, induration, or other signs of local infection.   Skin prepped in a sterile fashion.   Local anesthesia: Topical Ethyl chloride.   With sterile technique and under real time ultrasound guidance: 40 mg of Kenalog and 2 mL of lidocaine injected into knee joint. Fluid seen entering the joint capsule.   Completed without difficulty   Pain immediately resolved suggesting accurate placement of the medication.   Advised to call if fevers/chills, erythema, induration, drainage, or persistent bleeding.   Images permanently stored and available for review in the ultrasound unit.  Impression: Technically successful ultrasound guided injection.   EXAM: LEFT KNEE 3 VIEWS   COMPARISON:  None.   FINDINGS: No evidence of acute fracture or dislocation. Small joint effusion. No evidence of arthropathy or other focal bone abnormality. Soft tissues are unremarkable.   IMPRESSION: No acute osseous abnormality.   Small joint effusion.     Electronically Signed   By: Allegra Lai M.D.   On: 02/18/2021 11:40   I, Clementeen Graham, personally (independently) visualized and performed the interpretation of the images attached in this note.     Impression and Recommendations:    Assessment and Plan: 40 y.o. male with left knee pain.  Pain occurred somewhat spontaneously without obvious cause or injury. On exam patient does have some evidence of patellar tendinitis as well as some concern for a medial or lateral meniscal injury.  However he does not have a good explanation for this.  Gout is certainly a possibility.  Plan to treat with steroid injection, and check uric acid.  If not improving next step could be MRI.  PDMP not reviewed this encounter. Orders Placed This Encounter  Procedures   [place] LIMITED JOINT SPACE STRUCTURES LOW LEFT(NO LINKED CHARGES)    Standing Status:   Future    Number of  Occurrences:   1    Standing Expiration Date:   08/26/2021    Order Specific Question:   Reason for Exam (SYMPTOM  OR DIAGNOSIS REQUIRED)    Answer:   left knee pain    Order Specific Question:   Preferred imaging location?    Answer:   [facility]   Uric acid    Standing Status:   Future    Number of Occurrences:   1    Standing Expiration Date:   02/26/2022   No orders of the defined types were placed in this encounter.   Discussed warning signs or symptoms. Please see discharge instructions. Patient expresses understanding.   The above documentation has been reviewed and is accurate and complete Clementeen Graham, M.D.

## 2021-02-26 ENCOUNTER — Encounter: Payer: Self-pay | Admitting: Family Medicine

## 2021-02-26 ENCOUNTER — Ambulatory Visit: Payer: Self-pay

## 2021-02-26 ENCOUNTER — Ambulatory Visit: Payer: Federal, State, Local not specified - PPO | Admitting: Family Medicine

## 2021-02-26 ENCOUNTER — Other Ambulatory Visit: Payer: Self-pay

## 2021-02-26 VITALS — BP 174/128 | HR 97 | Ht 73.0 in | Wt 265.8 lb

## 2021-02-26 DIAGNOSIS — M25562 Pain in left knee: Secondary | ICD-10-CM

## 2021-02-26 LAB — URIC ACID: Uric Acid, Serum: 8 mg/dL — ABNORMAL HIGH (ref 4.0–7.8)

## 2021-02-26 NOTE — Patient Instructions (Addendum)
Thank you for coming in today.   You received an injection today. Seek immediate medical attention if the joint becomes red, extremely painful, or is oozing fluid.   Please get labs today before you leave   Please complete the exercises that the athletic trainer went over with you:  View at [email or web address] using code: [email or web address]  Recheck back in 6 weeks or as needed.

## 2021-02-27 MED ORDER — COLCHICINE 0.6 MG PO TABS: 0.6000 mg | ORAL_TABLET | Freq: Every day | ORAL | 2 refills | Status: DC | PRN

## 2021-02-27 MED ORDER — ALLOPURINOL 300 MG PO TABS: 300.0000 mg | ORAL_TABLET | Freq: Every day | ORAL | 1 refills | Status: DC

## 2021-02-27 NOTE — Addendum Note (Signed)
Addended by: Rodolph Bong on: 02/27/2021 07:22 AM   Modules accepted: Orders

## 2021-02-27 NOTE — Progress Notes (Signed)
Uric acid is significantly elevated at 8.  Goal is to get it under 6.  I have prescribed 2 medicines for gout. 1) colchicine: Colchicine is a short-term medicine used to manage gout right now.  Take it daily as needed for acute gout flare. 2) allopurinol: This is a long-term medicine for gout which lowers uric acid and ultimately prevent gout.  Take it daily.  This is going to be a long-term medicine.  We want a recheck your level in about 6 weeks.

## 2021-03-24 DIAGNOSIS — Z3141 Encounter for fertility testing: Secondary | ICD-10-CM | POA: Diagnosis not present

## 2021-03-30 ENCOUNTER — Other Ambulatory Visit: Payer: Self-pay | Admitting: Osteopathic Medicine

## 2021-03-30 MED ORDER — AMPHETAMINE-DEXTROAMPHETAMINE 20 MG PO TABS: 20.0000 mg | ORAL_TABLET | Freq: Two times a day (BID) | ORAL | 0 refills | Status: DC

## 2021-03-30 NOTE — Telephone Encounter (Signed)
Adderall sent, other medications he can contact the pharmacy, he should have refills. Thanks!

## 2021-03-30 NOTE — Telephone Encounter (Signed)
The only medication without refills and written by Lyn Hollingshead is Adderall.   Last written 10/28/2020 #180 with no refills Last appt 01/05/2021   One month pended, please advise.

## 2021-03-30 NOTE — Telephone Encounter (Signed)
Rx sent 

## 2021-03-30 NOTE — Telephone Encounter (Signed)
LVM letting patient know information below. AM 

## 2021-03-30 NOTE — Telephone Encounter (Signed)
Patient called stating he is about to run out of medications listed below & needs a refill up until his appt date on 05/06/21 ( transfer of care with Christen Butter ) Routing to Alexander's covering provider.   CIALIS medication goes to [facility] and the rest of the meds get sent to [facility].

## 2021-04-13 ENCOUNTER — Other Ambulatory Visit: Payer: Self-pay

## 2021-04-13 ENCOUNTER — Emergency Department (INDEPENDENT_AMBULATORY_CARE_PROVIDER_SITE_OTHER)
Admission: RE | Admit: 2021-04-13 | Discharge: 2021-04-13 | Disposition: A | Discharge status: Home / Self Care | Payer: Federal, State, Local not specified - PPO | Source: Ambulatory Visit

## 2021-04-13 VITALS — BP 138/102 | HR 115 | Temp 97.7°F | Resp 18

## 2021-04-13 DIAGNOSIS — R059 Cough, unspecified: Secondary | ICD-10-CM

## 2021-04-13 MED ORDER — CEFDINIR 300 MG PO CAPS: 300.0000 mg | ORAL_CAPSULE | Freq: Two times a day (BID) | ORAL | 0 refills | Status: AC

## 2021-04-13 MED ORDER — BENZONATATE 200 MG PO CAPS: 200.0000 mg | ORAL_CAPSULE | Freq: Three times a day (TID) | ORAL | 0 refills | Status: AC | PRN

## 2021-04-13 MED ORDER — PREDNISONE 20 MG PO TABS: ORAL_TABLET | ORAL | 0 refills | Status: DC

## 2021-04-13 NOTE — Discharge Instructions (Addendum)
Advised patient to take medication as directed with food to completion.  Advised patient may take Prednisone burst with first dose of cefdinir for 5 of 7 days.  Advised patient may use Tessalon Perles daily or as needed for cough.  Encouraged patient to increase daily water intake while taking these medications.

## 2021-04-13 NOTE — ED Triage Notes (Signed)
Pt c/o cough, chills and sore throat x 1 week. No recorded temps. Also c/o SOB and vomiting that started yesterday. Advil and dayquil/nyquil prn. Hx of allergy and exercise induced asthma. COVID test neg at home yesterday.

## 2021-04-13 NOTE — ED Provider Notes (Signed)
[facility]    CSN: [id number] Arrival date & time: 04/13/21  1402      History   Chief Complaint Chief Complaint  Patient presents with   Chills    APPT 2pm   Cough   Sore Throat    HPI Brett Brown is a 40 y.o. male.   HPI 40 year old male presents with cough, chills, and sore throat for 1 week.  No recorded temp.  Reports shortness of breath and vomiting yesterday.  Patient reports negative home COVID-19 test yesterday.  Patient reports history of exercise-induced asthma.  Past Medical History:  Diagnosis Date   ADHD    Hypertension     Patient Active Problem List   Diagnosis Date Noted   Testosterone deficiency in male 01/05/2021   ED (erectile dysfunction) 06/18/2019   GAD (generalized anxiety disorder) 10/05/2018   HTN (hypertension) 04/19/2018   ADHD 04/19/2018   Obese 04/19/2018    Past Surgical History:  Procedure Laterality Date   ROTATOR CUFF REPAIR Right 2018   thumb surg near amputation retached Right 03/29/2018   [facility]       Home Medications    Prior to Admission medications   Medication Sig Start Date End Date Taking? Authorizing Provider  benzonatate (TESSALON) 200 MG capsule Take 1 capsule (200 mg total) by mouth 3 (three) times daily as needed for up to 7 days for cough. 04/13/21 04/20/21 Yes Trevor Iha, FNP  cefdinir (OMNICEF) 300 MG capsule Take 1 capsule (300 mg total) by mouth 2 (two) times daily for 7 days. 04/13/21 04/20/21 Yes Trevor Iha, FNP  predniSONE (DELTASONE) 20 MG tablet Take 3 tabs PO daily x 5 days. 04/13/21  Yes Trevor Iha, FNP  allopurinol (ZYLOPRIM) 300 MG tablet Take 1 tablet (300 mg total) by mouth daily. 02/27/21   Rodolph Bong, MD  amphetamine-dextroamphetamine (ADDERALL) 20 MG tablet Take 1 tablet (20 mg total) by mouth 2 (two) times daily. 03/30/21 06/28/21  Jomarie Longs, PA-C  colchicine 0.6 MG tablet Take 1 tablet (0.6 mg total) by mouth daily as needed (gout or  psuedogout pain). 02/27/21   Rodolph Bong, MD  escitalopram (LEXAPRO) 5 MG tablet 1 PO qd, may increase to 2 PO qd if needed 01/21/21   Kathryne Hitch, MD  ibuprofen (ADVIL) 200 MG tablet Take 3 tablets (600 mg total) by mouth every 6 (six) hours. 03/29/18   Mack Hook, MD  tadalafil (CIALIS) 20 MG tablet Take 1 tablet (20 mg total) by mouth daily as needed for erectile dysfunction. 12/26/20   Hilts, Casimiro Needle, MD    Family History Family History  Problem Relation Age of Onset   Healthy Mother    Hypertension Father    Cancer Father 72       bladder cancer   Bladder Cancer Father    Healthy Sister    Healthy Brother    Thyroid disease Neg Hx     Social History Social History   Tobacco Use   Smoking status: Never   Smokeless tobacco: Never  Vaping Use   Vaping Use: Never used  Substance Use Topics   Alcohol use: No   Drug use: No     Allergies   Patient has no known allergies.   Review of Systems Review of Systems  Constitutional:  Positive for chills.  HENT:  Positive for congestion and sore throat.   Respiratory:  Positive for cough.   All other systems reviewed and are negative.  Physical Exam Triage Vital Signs ED Triage Vitals  Enc Vitals Group     BP 04/13/21 1530 (!) 138/102     Pulse Rate 04/13/21 1530 (!) 115     Resp 04/13/21 1530 18     Temp 04/13/21 1530 97.7 F (36.5 C)     Temp Source 04/13/21 1530 Oral     SpO2 04/13/21 1530 97 %     Weight --      Height --      Head Circumference --      Peak Flow --      Pain Score 04/13/21 1520 0     Pain Loc --      Pain Edu? --      Excl. in GC? --    No data found.  Updated Vital Signs BP (!) 138/102 (BP Location: Left Arm)   Pulse (!) 115   Temp 97.7 F (36.5 C) (Oral)   Resp 18   SpO2 97%    Physical Exam Vitals and nursing note reviewed.  Constitutional:      Appearance: Normal appearance. He is normal weight.  HENT:     Head: Normocephalic and atraumatic.     Right  Ear: Tympanic membrane, ear canal and external ear normal.     Left Ear: Tympanic membrane, ear canal and external ear normal.     Mouth/Throat:     Mouth: Mucous membranes are moist.     Pharynx: Oropharynx is clear.  Eyes:     Extraocular Movements: Extraocular movements intact.     Conjunctiva/sclera: Conjunctivae normal.     Pupils: Pupils are equal, round, and reactive to light.  Cardiovascular:     Rate and Rhythm: Normal rate and regular rhythm.     Pulses: Normal pulses.     Heart sounds: Normal heart sounds.  Pulmonary:     Effort: Pulmonary effort is normal.     Breath sounds: No stridor. Rhonchi present. No wheezing or rales.     Comments: Mild diffuse scattered rhonchi noted throughout Musculoskeletal:        General: Normal range of motion.     Cervical back: Normal range of motion and neck supple.  Skin:    General: Skin is warm and dry.  Neurological:     General: No focal deficit present.     Mental Status: He is alert and oriented to person, place, and time. Mental status is at baseline.     UC Treatments / Results  Labs (all labs ordered are listed, but only abnormal results are displayed) Labs Reviewed - No data to display  EKG   Radiology No results found.  Procedures Procedures (including critical care time)  Medications Ordered in UC Medications - No data to display  Initial Impression / Assessment and Plan / UC Course  I have reviewed the triage vital signs and the nursing notes.  Pertinent labs & imaging results that were available during my care of the patient were reviewed by me and considered in my medical decision making (see chart for details).     MDM: 1.  Cough-Rx'd Cefdinir, Prednisone burst, and Tessalon Perles. Advised patient to take medication as directed with food to completion.  Advised patient may take Prednisone burst with first dose of cefdinir for 5 of 7 days.  Advised patient may use Tessalon Perles daily or as needed for  cough.  Encouraged patient to increase daily water intake while taking these medications.  Patient discharged home, hemodynamically stable.  Final Clinical Impressions(s) / UC Diagnoses   Final diagnoses:  Cough, unspecified type     Discharge Instructions      Advised patient to take medication as directed with food to completion.  Advised patient may take Prednisone burst with first dose of cefdinir for 5 of 7 days.  Advised patient may use Tessalon Perles daily or as needed for cough.  Encouraged patient to increase daily water intake while taking these medications.     ED Prescriptions     Medication Sig Dispense Auth. Provider   cefdinir (OMNICEF) 300 MG capsule Take 1 capsule (300 mg total) by mouth 2 (two) times daily for 7 days. 14 capsule Trevor Iha, FNP   predniSONE (DELTASONE) 20 MG tablet Take 3 tabs PO daily x 5 days. 15 tablet Trevor Iha, FNP   benzonatate (TESSALON) 200 MG capsule Take 1 capsule (200 mg total) by mouth 3 (three) times daily as needed for up to 7 days for cough. 40 capsule Trevor Iha, FNP      PDMP not reviewed this encounter.   Trevor Iha, FNP 04/13/21 [phone]

## 2021-05-06 ENCOUNTER — Ambulatory Visit: Payer: Federal, State, Local not specified - PPO | Admitting: Medical-Surgical

## 2021-05-14 ENCOUNTER — Other Ambulatory Visit: Payer: Self-pay

## 2021-05-14 ENCOUNTER — Encounter: Payer: Self-pay | Admitting: Medical-Surgical

## 2021-05-14 ENCOUNTER — Ambulatory Visit: Payer: Federal, State, Local not specified - PPO | Admitting: Medical-Surgical

## 2021-05-14 VITALS — BP 159/90 | HR 86 | Ht 73.0 in | Wt 285.0 lb

## 2021-05-14 DIAGNOSIS — N529 Male erectile dysfunction, unspecified: Secondary | ICD-10-CM | POA: Diagnosis not present

## 2021-05-14 DIAGNOSIS — E291 Testicular hypofunction: Secondary | ICD-10-CM | POA: Diagnosis not present

## 2021-05-14 DIAGNOSIS — M25562 Pain in left knee: Secondary | ICD-10-CM

## 2021-05-14 DIAGNOSIS — R6882 Decreased libido: Secondary | ICD-10-CM

## 2021-05-14 DIAGNOSIS — F411 Generalized anxiety disorder: Secondary | ICD-10-CM

## 2021-05-14 DIAGNOSIS — Z7689 Persons encountering health services in other specified circumstances: Secondary | ICD-10-CM

## 2021-05-14 DIAGNOSIS — I10 Essential (primary) hypertension: Secondary | ICD-10-CM

## 2021-05-14 DIAGNOSIS — F902 Attention-deficit hyperactivity disorder, combined type: Secondary | ICD-10-CM

## 2021-05-14 MED ORDER — ESCITALOPRAM OXALATE 10 MG PO TABS: ORAL_TABLET | ORAL | 1 refills | Status: DC

## 2021-05-14 MED ORDER — TADALAFIL 20 MG PO TABS: 20.0000 mg | ORAL_TABLET | Freq: Every day | ORAL | 6 refills | Status: DC | PRN

## 2021-05-14 MED ORDER — AMPHETAMINE-DEXTROAMPHETAMINE 20 MG PO TABS: 20.0000 mg | ORAL_TABLET | Freq: Two times a day (BID) | ORAL | 0 refills | Status: DC

## 2021-05-14 MED ORDER — ALLOPURINOL 300 MG PO TABS: 300.0000 mg | ORAL_TABLET | Freq: Every day | ORAL | 1 refills | Status: DC

## 2021-05-14 NOTE — Progress Notes (Signed)
°  HPI with pertinent ROS:   CC: transfer of care  HPI: Pleasant 40 year old male presenting today to transfer care to a new PCP and for follow up on:  HTN- previously prescribed Lisinopril but has only been taking it as needed. Reports his BP was doing good so he stopped taking the medication. Not regularly checking his BP at home. Not adding salt to foods often but does have fast food on occasion. Not exercising regularly.  Low testosterone/libido- due for testosterone recheck. Using Cialis 20mg  daily prn for erectile dysfunction with good results. Requesting refill.   ADHD- taking Adderall IR 20mg  BID as prescribed. Tried the XR formula which worked well but caused severe reflux symptoms. Does well on the instant release. Requesting refills.   Mood- taking Lexapro 5mg  daily, tolerating well without side effects. Has been on this a few months and thinks it's helping but could work better. Interested in possibly increasing the dose to better manage symptoms. Denies SI/HI.   Gout- taking Allopurinol 300mg  daily with prn colchicine for gout flares. Just started this a month ago. No flares since starting the medication. Avoid foods known to flare gout. Requesting refills.  I reviewed the past medical history, family history, social history, surgical history, and allergies today and no changes were needed.  Please see the problem list section below in epic for further details.  Physical exam:   General: Well Developed, well nourished, and in no acute distress.  Neuro: Alert and oriented x3. HEENT: Normocephalic, atraumatic.  Skin: Warm and dry. Cardiac: Regular rate and rhythm, no murmurs rubs or gallops, no lower extremity edema.  Respiratory: Clear to auscultation bilaterally. Not using accessory muscles, speaking in full sentences.  Impression and Recommendations:    1. Encounter to establish care Reviewed available information and discussed care concerns with patient.   2. Primary  hypertension Resume lisinopril 10mg  daily. Recommend exercise at least 3 times weekly. Low sodium diet. Monitor BP at home with a goal of 130/80 or less.   3. Attention deficit hyperactivity disorder (ADHD), combined type Continue Adderall 20mg  BID. Refills sent.   4. Erectile dysfunction, unspecified erectile dysfunction type Continue Cialis 20mg  daily prn.   5. GAD (generalized anxiety disorder) Increasing Lexapro to 10mg  daily.   6. Testosterone deficiency in male Prior labs already entered. Printed requisition for blood draw today.   7. Low libido Checking labs. Continue Cialis as above.   8. Acute pain of left knee Continue Allopurinol daily with prn colchicine. Refills sent.   Return for mood follow up in 4-6 weeks. ___________________________________________ , DNP, APRN, FNP-BC Primary Care and Sports Medicine [facility]

## 2021-05-15 LAB — TESTOSTERONE TOTAL,FREE,BIO, MALES
Albumin: 4.8 g/dL (ref 3.6–5.1)
Sex Hormone Binding: 18 nmol/L (ref 10–50)
Testosterone, Bioavailable: 120 ng/dL (ref 110.0–575.0)
Testosterone, Free: 54.9 pg/mL (ref 46.0–224.0)
Testosterone: 286 ng/dL (ref 250–827)

## 2021-05-15 LAB — IRON,TIBC AND FERRITIN PANEL
%SAT: 18 % (calc) — ABNORMAL LOW (ref 20–48)
Ferritin: 46 ng/mL (ref 38–380)
Iron: 78 ug/dL (ref 50–180)
TIBC: 424 mcg/dL (calc) (ref 250–425)

## 2021-05-15 LAB — PROLACTIN: Prolactin: 16.3 ng/mL (ref 2.0–18.0)

## 2021-05-15 LAB — FSH/LH
FSH: 4.1 m[IU]/mL (ref 1.6–8.0)
LH: 5.2 m[IU]/mL (ref 1.5–9.3)

## 2021-05-15 LAB — CORTISOL-AM, BLOOD: Cortisol - AM: 14.6 ug/dL

## 2021-05-19 NOTE — Progress Notes (Signed)
To PCP

## 2021-06-16 ENCOUNTER — Other Ambulatory Visit: Payer: Self-pay | Admitting: Medical-Surgical

## 2021-06-16 DIAGNOSIS — E538 Deficiency of other specified B group vitamins: Secondary | ICD-10-CM | POA: Diagnosis not present

## 2021-06-16 DIAGNOSIS — E559 Vitamin D deficiency, unspecified: Secondary | ICD-10-CM | POA: Diagnosis not present

## 2021-06-16 DIAGNOSIS — E291 Testicular hypofunction: Secondary | ICD-10-CM | POA: Diagnosis not present

## 2021-06-16 DIAGNOSIS — R5383 Other fatigue: Secondary | ICD-10-CM | POA: Diagnosis not present

## 2021-06-16 DIAGNOSIS — G4700 Insomnia, unspecified: Secondary | ICD-10-CM | POA: Diagnosis not present

## 2021-06-16 DIAGNOSIS — F419 Anxiety disorder, unspecified: Secondary | ICD-10-CM | POA: Diagnosis not present

## 2021-06-16 DIAGNOSIS — R790 Abnormal level of blood mineral: Secondary | ICD-10-CM | POA: Diagnosis not present

## 2021-06-25 ENCOUNTER — Telehealth (INDEPENDENT_AMBULATORY_CARE_PROVIDER_SITE_OTHER): Payer: Federal, State, Local not specified - PPO | Admitting: Medical-Surgical

## 2021-06-25 ENCOUNTER — Encounter: Payer: Self-pay | Admitting: Medical-Surgical

## 2021-06-25 DIAGNOSIS — F411 Generalized anxiety disorder: Secondary | ICD-10-CM

## 2021-06-25 DIAGNOSIS — I10 Essential (primary) hypertension: Secondary | ICD-10-CM | POA: Diagnosis not present

## 2021-06-25 DIAGNOSIS — F902 Attention-deficit hyperactivity disorder, combined type: Secondary | ICD-10-CM

## 2021-06-25 NOTE — Progress Notes (Signed)
Higher dose lexapro working great

## 2021-06-25 NOTE — Progress Notes (Signed)
Virtual Visit via Video Note  I connected with Brett Brown on 06/25/21 at  9:30 AM EST by a video enabled telemedicine application and verified that I am speaking with the correct person using two identifiers.   I discussed the limitations of evaluation and management by telemedicine and the availability of in person appointments. The patient expressed understanding and agreed to proceed.  Patient location: home Provider locations: office  Subjective:    CC: Mood follow-up  HPI: Pleasant 41 year old male presenting via [place] video visit for follow-up on mood.  Has been doing well on Lexapro 10 mg daily, tolerating well without side effects.  Feels this keeps his mood very stable and is happy with the result.  Reports his wife has also noticed a difference and is happy with the result.  Denies SI/HI.  Elevated blood pressure reading at his last visit.  He was advised that we need to monitor this in conjunction with his Adderall prescription.  He has recently checked his blood pressure with the result of 132/86. Denies CP, SOB, palpitations, lower extremity edema, dizziness, headaches, or vision changes.  ADHD-has had trouble like many folks getting his Adderall from the pharmacy due to [place].  He does take Adderall 20 mg instant release twice daily every day.  Feels the medication works well for him and has been treated with this long-term.  Notes that the pharmacy gave him an amphetamine salt on his last visit and he feels like that works a little better.   Past medical history, Surgical history, Family history not pertinant except as noted below, Social history, Allergies, and medications have been entered into the medical record, reviewed, and corrections made.   Review of Systems: See HPI for pertinent positives and negatives.   Objective:    General: Speaking clearly in complete sentences without any shortness of breath.  Alert and oriented x3.  Normal judgment. No  apparent acute distress.  Impression and Recommendations:    1. GAD (generalized anxiety disorder) Symptoms well controlled.  Continue Lexapro 10 mg daily.  2. Primary hypertension Continue monitoring blood pressure.  132/86 is acceptable however if it continually runs higher than this, he will need intervention.  Advised him to let us know if his readings stay elevated.  3. Attention deficit hyperactivity disorder (ADHD), combined type Continue Adderall IR 20 mg twice daily as prescribed.  When due for refills, advised him to let us know what pharmacy to send them to after checking to see if it is in stock.  I discussed the assessment and treatment plan with the patient. The patient was provided an opportunity to ask questions and all were answered. The patient agreed with the plan and demonstrated an understanding of the instructions.   The patient was advised to call back or seek an in-person evaluation if the symptoms worsen or if the condition fails to improve as anticipated.  20 minutes of non-face-to-face time was provided during this encounter.  Return in about 3 months (around 09/22/2021) for ADHD/mood/hypertension follow up.  Clearnce Sorrel, DNP, APRN, FNP-BC [facility]

## 2021-07-07 DIAGNOSIS — F419 Anxiety disorder, unspecified: Secondary | ICD-10-CM | POA: Diagnosis not present

## 2021-07-07 DIAGNOSIS — E291 Testicular hypofunction: Secondary | ICD-10-CM | POA: Diagnosis not present

## 2021-07-07 DIAGNOSIS — R790 Abnormal level of blood mineral: Secondary | ICD-10-CM | POA: Diagnosis not present

## 2021-07-07 DIAGNOSIS — R5383 Other fatigue: Secondary | ICD-10-CM | POA: Diagnosis not present

## 2021-07-14 ENCOUNTER — Telehealth: Payer: Self-pay

## 2021-07-14 NOTE — Telephone Encounter (Addendum)
Initiated Prior authorization OVZ:[id number] 20MG  tablets Via: Covermymeds Case/Key:[id number] Status: approved  Reason:The authorization is valid from 06/14/2021 to 07/14/2022. as of 07/14/21 Notified Pt via: Mychart

## 2021-07-23 ENCOUNTER — Other Ambulatory Visit: Payer: Self-pay | Admitting: Medical-Surgical

## 2021-07-26 ENCOUNTER — Other Ambulatory Visit: Payer: Self-pay | Admitting: Medical-Surgical

## 2021-09-08 ENCOUNTER — Other Ambulatory Visit: Payer: Self-pay | Admitting: Medical-Surgical

## 2021-09-08 MED ORDER — AMPHETAMINE-DEXTROAMPHETAMINE 20 MG PO TABS: 20.0000 mg | ORAL_TABLET | Freq: Two times a day (BID) | ORAL | 0 refills | Status: DC

## 2021-10-18 ENCOUNTER — Other Ambulatory Visit: Payer: Self-pay | Admitting: Medical-Surgical

## 2021-10-19 NOTE — Telephone Encounter (Signed)
Please review refill request.  I am not authorized to either refuse or approve refill request for this medication.  T. Lashunda Greis, CMA 

## 2021-10-20 ENCOUNTER — Telehealth: Payer: Self-pay | Admitting: Medical-Surgical

## 2021-10-20 ENCOUNTER — Other Ambulatory Visit: Payer: Self-pay | Admitting: Medical-Surgical

## 2021-10-20 MED ORDER — AMPHETAMINE-DEXTROAMPHETAMINE 20 MG PO TABS
20.0000 mg | ORAL_TABLET | Freq: Two times a day (BID) | ORAL | 0 refills | Status: DC
Start: 2021-10-20 — End: 2021-11-25

## 2021-10-20 NOTE — Telephone Encounter (Signed)
Patient wife called and started the patient need a refill on his adderall. Patient is scheduled for 7/25 at 10:10 but will be out before then. Patient can only be seen in the morning due to work schedule. Can a refill be sent until 7/25 or can he be worked in on the schedule or see a different provider. Please advise.

## 2021-10-21 NOTE — Telephone Encounter (Signed)
Spoke with Patient wife again and she will make him aware.

## 2021-10-27 DIAGNOSIS — Z3141 Encounter for fertility testing: Secondary | ICD-10-CM | POA: Diagnosis not present

## 2021-11-03 ENCOUNTER — Inpatient Hospital Stay: Admission: RE | Admit: 2021-11-03 | Payer: Self-pay | Source: Ambulatory Visit

## 2021-11-25 ENCOUNTER — Other Ambulatory Visit: Payer: Self-pay | Admitting: Medical-Surgical

## 2021-11-25 MED ORDER — AMPHETAMINE-DEXTROAMPHETAMINE 20 MG PO TABS: 20.0000 mg | ORAL_TABLET | Freq: Two times a day (BID) | ORAL | 0 refills | Status: DC

## 2021-11-25 NOTE — Telephone Encounter (Signed)
Absolutely no further refills until patient has attended an appointment for follow-up.  Was due for follow-up in May for ADHD, mood, etc.

## 2021-11-25 NOTE — Telephone Encounter (Signed)
Last OV: 06/25/21 Next OV: 12/08/21 Last refill: 10/20/21

## 2021-12-07 NOTE — Progress Notes (Unsigned)
   Established Patient Office Visit  Subjective   Patient ID: Brett Brown, male   DOB: Oct 03, 1980 Age: 41 y.o. MRN: [id number]   No chief complaint on file.   HPI Pleasant 41 year old male presenting today for follow-up on ADHD. Medication: *** How long: *** Scheduled or as needed: *** Compliant: *** Appetite changes: *** Difficulty sleeping: *** Weight fluctuations: *** Side effects: ***   Objective:    There were no vitals filed for this visit.  Physical Exam   No results found for this or any previous visit (from the past 24 hour(s)).   {Labs (Optional):23779}  The ASCVD Risk score (Arnett DK, et al., 2019) failed to calculate for the following reasons:   The systolic blood pressure is missing   Assessment & Plan:   No problem-specific Assessment & Plan notes found for this encounter.   No follow-ups on file.  ___________________________________________ Thayer Ohm, DNP, APRN, FNP-BC [facility]

## 2021-12-08 ENCOUNTER — Ambulatory Visit: Payer: Federal, State, Local not specified - PPO | Admitting: Medical-Surgical

## 2021-12-08 ENCOUNTER — Encounter: Payer: Self-pay | Admitting: Medical-Surgical

## 2021-12-08 VITALS — BP 117/79 | HR 79 | Ht 73.0 in | Wt 266.0 lb

## 2021-12-08 DIAGNOSIS — F902 Attention-deficit hyperactivity disorder, combined type: Secondary | ICD-10-CM | POA: Diagnosis not present

## 2021-12-08 DIAGNOSIS — F411 Generalized anxiety disorder: Secondary | ICD-10-CM

## 2021-12-08 DIAGNOSIS — I10 Essential (primary) hypertension: Secondary | ICD-10-CM

## 2021-12-08 DIAGNOSIS — N529 Male erectile dysfunction, unspecified: Secondary | ICD-10-CM

## 2021-12-08 DIAGNOSIS — M1A00X0 Idiopathic chronic gout, unspecified site, without tophus (tophi): Secondary | ICD-10-CM

## 2021-12-08 MED ORDER — AMPHETAMINE-DEXTROAMPHETAMINE 20 MG PO TABS: 20.0000 mg | ORAL_TABLET | Freq: Two times a day (BID) | ORAL | 0 refills | Status: DC

## 2021-12-08 MED ORDER — COLCHICINE 0.6 MG PO TABS: 0.6000 mg | ORAL_TABLET | Freq: Every day | ORAL | 2 refills | Status: DC | PRN

## 2021-12-08 MED ORDER — SILDENAFIL CITRATE 20 MG PO TABS: 20.0000 mg | ORAL_TABLET | ORAL | 11 refills | Status: DC | PRN

## 2021-12-08 MED ORDER — ALLOPURINOL 300 MG PO TABS: 300.0000 mg | ORAL_TABLET | Freq: Every day | ORAL | 1 refills | Status: DC

## 2021-12-08 MED ORDER — ESCITALOPRAM OXALATE 10 MG PO TABS
ORAL_TABLET | ORAL | 0 refills | Status: DC
Start: 2021-12-08 — End: 2023-03-16

## 2021-12-21 ENCOUNTER — Other Ambulatory Visit: Payer: Self-pay | Admitting: Family Medicine

## 2021-12-22 NOTE — Telephone Encounter (Signed)
Rx refill request approved per Dr. Corey's orders. 

## 2022-03-15 NOTE — Progress Notes (Unsigned)
   Established Patient Office Visit  Subjective   Patient ID: Brett Brown, male   DOB: 06-19-1980 Age: 41 y.o. MRN: [id number]   No chief complaint on file.   HPI    Objective:    There were no vitals filed for this visit.  Physical Exam   No results found for this or any previous visit (from the past 24 hour(s)).   {Labs (Optional):23779}  The 10-year ASCVD risk score (Arnett DK, et al., 2019) is: 1.5%   Values used to calculate the score:     Age: 40 years     Sex: Male     Is Non-Hispanic African American: No     Diabetic: No     Tobacco smoker: No     Systolic Blood Pressure: 456 mmHg     Is BP treated: No     HDL Cholesterol: 57 mg/dL     Total Cholesterol: 228 mg/dL   Assessment & Plan:   No problem-specific Assessment & Plan notes found for this encounter.   No follow-ups on file.  ___________________________________________ Clearnce Sorrel, DNP, APRN, FNP-BC [facility] [place]

## 2022-03-16 ENCOUNTER — Encounter: Payer: Self-pay | Admitting: Medical-Surgical

## 2022-03-16 ENCOUNTER — Ambulatory Visit: Payer: Federal, State, Local not specified - PPO | Admitting: Medical-Surgical

## 2022-03-16 VITALS — BP 129/80 | HR 104 | Resp 20 | Ht 73.0 in | Wt 239.7 lb

## 2022-03-16 DIAGNOSIS — F902 Attention-deficit hyperactivity disorder, combined type: Secondary | ICD-10-CM

## 2022-03-16 DIAGNOSIS — N522 Drug-induced erectile dysfunction: Secondary | ICD-10-CM | POA: Diagnosis not present

## 2022-03-16 MED ORDER — AMPHETAMINE-DEXTROAMPHETAMINE 20 MG PO TABS: 20.0000 mg | ORAL_TABLET | Freq: Two times a day (BID) | ORAL | 0 refills | Status: DC

## 2022-03-16 MED ORDER — TADALAFIL 20 MG PO TABS: 10.0000 mg | ORAL_TABLET | ORAL | 11 refills | Status: DC | PRN

## 2022-05-19 ENCOUNTER — Other Ambulatory Visit: Payer: Self-pay | Admitting: Medical-Surgical

## 2022-05-19 MED ORDER — AMPHETAMINE-DEXTROAMPHETAMINE 20 MG PO TABS: 20.0000 mg | ORAL_TABLET | Freq: Two times a day (BID) | ORAL | 0 refills | Status: DC

## 2022-06-16 ENCOUNTER — Other Ambulatory Visit: Payer: Self-pay | Admitting: Medical-Surgical

## 2022-06-21 ENCOUNTER — Other Ambulatory Visit: Payer: Self-pay | Admitting: Medical-Surgical

## 2022-07-03 DIAGNOSIS — U071 COVID-19: Secondary | ICD-10-CM | POA: Diagnosis not present

## 2022-07-03 DIAGNOSIS — R6889 Other general symptoms and signs: Secondary | ICD-10-CM | POA: Diagnosis not present

## 2022-07-14 ENCOUNTER — Other Ambulatory Visit: Payer: Self-pay | Admitting: Medical-Surgical

## 2022-07-18 ENCOUNTER — Other Ambulatory Visit: Payer: Self-pay | Admitting: Medical-Surgical

## 2022-07-26 ENCOUNTER — Other Ambulatory Visit: Payer: Self-pay | Admitting: Medical-Surgical

## 2022-07-27 MED ORDER — AMPHETAMINE-DEXTROAMPHETAMINE 20 MG PO TABS: 20.0000 mg | ORAL_TABLET | Freq: Two times a day (BID) | ORAL | 0 refills | Status: DC

## 2022-08-19 ENCOUNTER — Other Ambulatory Visit: Payer: Self-pay | Admitting: Medical-Surgical

## 2022-08-31 ENCOUNTER — Other Ambulatory Visit: Payer: Self-pay | Admitting: Medical-Surgical

## 2022-09-02 ENCOUNTER — Other Ambulatory Visit: Payer: Self-pay | Admitting: Medical-Surgical

## 2022-09-04 ENCOUNTER — Other Ambulatory Visit: Payer: Self-pay | Admitting: Medical-Surgical

## 2022-09-14 ENCOUNTER — Ambulatory Visit: Payer: Federal, State, Local not specified - PPO | Admitting: Medical-Surgical

## 2022-09-14 ENCOUNTER — Encounter: Payer: Self-pay | Admitting: Medical-Surgical

## 2022-09-14 VITALS — BP 122/75 | HR 120 | Resp 20 | Ht 73.0 in | Wt 239.0 lb

## 2022-09-14 DIAGNOSIS — L989 Disorder of the skin and subcutaneous tissue, unspecified: Secondary | ICD-10-CM | POA: Diagnosis not present

## 2022-09-14 DIAGNOSIS — F902 Attention-deficit hyperactivity disorder, combined type: Secondary | ICD-10-CM

## 2022-09-14 MED ORDER — AMPHETAMINE-DEXTROAMPHETAMINE 20 MG PO TABS: 20.0000 mg | ORAL_TABLET | Freq: Two times a day (BID) | ORAL | 0 refills | Status: DC

## 2022-09-14 NOTE — Progress Notes (Signed)
        Established patient visit  History, exam, impression, and plan:  1. Attention deficit hyperactivity disorder (ADHD), combined type Pleasant 42 year old male presenting today with a history of ADHD well controlled with Adderall 20mg  BID. Has been on a stable regimen for years and feels that it still works well for him. No change in sleeping, appetite, or unexpected weight changes. Denies palpitations, chest pain, elevated BP, constipation, and jitteriness. Continue Adderall as prescribed.   2. Skin lesion Small skin lesion on the left pubic area that he is concerned about. The area started out very small but has gotten larger and slightly darker in color. After shaving, it occasionally burns but otherwise is asymptomatic. On exam, it appears to be a mole vs a small wart. Advised that this is benign but we can certainly treat with cryotherapy. Patient would like to have that done today. See below for procedure note.   Procedures performed this visit:  Cryotherapy template Procedure: Cryodestruction of: left groin skin lesion  Consent obtained and verified. Time-out conducted. Noted no overlying erythema, induration, or other signs of local infection. Completed without difficulty using Cryo-Gun. Advised to call if fevers/chills, erythema, induration, drainage, or persistent bleeding.  Return in about 6 months (around 03/16/2023) for ADHD follow up.  __________________________________ Thayer Ohm, DNP, APRN, FNP-BC [facility] [place]

## 2022-10-13 ENCOUNTER — Encounter (HOSPITAL_BASED_OUTPATIENT_CLINIC_OR_DEPARTMENT_OTHER): Payer: Self-pay | Admitting: Emergency Medicine

## 2022-10-13 ENCOUNTER — Ambulatory Visit (HOSPITAL_BASED_OUTPATIENT_CLINIC_OR_DEPARTMENT_OTHER)
Admission: EM | Admit: 2022-10-13 | Discharge: 2022-10-14 | Disposition: A | Discharge status: Home / Self Care | Payer: Federal, State, Local not specified - PPO | Attending: Emergency Medicine | Admitting: Emergency Medicine

## 2022-10-13 ENCOUNTER — Other Ambulatory Visit: Payer: Self-pay

## 2022-10-13 DIAGNOSIS — T190XXA Foreign body in urethra, initial encounter: Secondary | ICD-10-CM | POA: Insufficient documentation

## 2022-10-13 DIAGNOSIS — W449XXA Unspecified foreign body entering into or through a natural orifice, initial encounter: Secondary | ICD-10-CM | POA: Diagnosis not present

## 2022-10-13 DIAGNOSIS — T194XXA Foreign body in penis, initial encounter: Secondary | ICD-10-CM

## 2022-10-13 NOTE — ED Triage Notes (Signed)
Reports foreign body (tip of an enema) stuck in penis. Stuck since 9AM, able to urinate some but painful

## 2022-10-14 ENCOUNTER — Emergency Department (HOSPITAL_COMMUNITY): Payer: Federal, State, Local not specified - PPO | Admitting: Certified Registered Nurse Anesthetist

## 2022-10-14 ENCOUNTER — Encounter (HOSPITAL_COMMUNITY)
Admission: EM | Disposition: A | Discharge status: Home / Self Care | Payer: Self-pay | Source: Home / Self Care | Attending: Emergency Medicine

## 2022-10-14 DIAGNOSIS — T190XXA Foreign body in urethra, initial encounter: Secondary | ICD-10-CM | POA: Diagnosis not present

## 2022-10-14 DIAGNOSIS — T194XXA Foreign body in penis, initial encounter: Secondary | ICD-10-CM

## 2022-10-14 HISTORY — PX: CYSTOSCOPY: SHX5120

## 2022-10-14 LAB — SURGICAL PATHOLOGY

## 2022-10-14 SURGERY — CYSTOSCOPY
Anesthesia: General

## 2022-10-14 MED ORDER — ONDANSETRON HCL 4 MG/2ML IJ SOLN
INTRAMUSCULAR | Status: DC | PRN
  Administered 2022-10-14: 4 mg via INTRAVENOUS

## 2022-10-14 MED ORDER — MIDAZOLAM HCL 2 MG/2ML IJ SOLN
INTRAMUSCULAR | Status: AC
  Filled 2022-10-14: qty 2

## 2022-10-14 MED ORDER — OXYCODONE HCL 5 MG/5ML PO SOLN: 5.0000 mg | Freq: Once | ORAL | Status: DC | PRN

## 2022-10-14 MED ORDER — MIDAZOLAM HCL 5 MG/5ML IJ SOLN
INTRAMUSCULAR | Status: DC | PRN
  Administered 2022-10-14: 2 mg via INTRAVENOUS

## 2022-10-14 MED ORDER — LIDOCAINE 2% (20 MG/ML) 5 ML SYRINGE
INTRAMUSCULAR | Status: DC | PRN
  Administered 2022-10-14: 100 mg via INTRAVENOUS

## 2022-10-14 MED ORDER — DEXAMETHASONE SODIUM PHOSPHATE 10 MG/ML IJ SOLN
INTRAMUSCULAR | Status: DC | PRN
  Administered 2022-10-14: 4 mg via INTRAVENOUS

## 2022-10-14 MED ORDER — SULFAMETHOXAZOLE-TRIMETHOPRIM 800-160 MG PO TABS: 1.0000 | ORAL_TABLET | Freq: Two times a day (BID) | ORAL | 0 refills | Status: AC

## 2022-10-14 MED ORDER — SODIUM CHLORIDE 0.9 % IV SOLN
1.0000 g | Freq: Once | INTRAVENOUS | Status: AC
  Administered 2022-10-14: 1 g via INTRAVENOUS
  Filled 2022-10-14: qty 10

## 2022-10-14 MED ORDER — FENTANYL CITRATE (PF) 100 MCG/2ML IJ SOLN
INTRAMUSCULAR | Status: AC
  Filled 2022-10-14: qty 2

## 2022-10-14 MED ORDER — PROPOFOL 10 MG/ML IV BOLUS
INTRAVENOUS | Status: DC | PRN
  Administered 2022-10-14: 200 mg via INTRAVENOUS

## 2022-10-14 MED ORDER — MEPERIDINE HCL 50 MG/ML IJ SOLN: 6.2500 mg | INTRAMUSCULAR | Status: DC | PRN

## 2022-10-14 MED ORDER — ONDANSETRON HCL 4 MG/2ML IJ SOLN
INTRAMUSCULAR | Status: AC
  Filled 2022-10-14: qty 2

## 2022-10-14 MED ORDER — DEXAMETHASONE SODIUM PHOSPHATE 10 MG/ML IJ SOLN
INTRAMUSCULAR | Status: AC
  Filled 2022-10-14: qty 1

## 2022-10-14 MED ORDER — PROPOFOL 10 MG/ML IV BOLUS
INTRAVENOUS | Status: AC
  Filled 2022-10-14: qty 20

## 2022-10-14 MED ORDER — ONDANSETRON HCL 4 MG/2ML IJ SOLN: 4.0000 mg | Freq: Once | INTRAMUSCULAR | Status: DC | PRN

## 2022-10-14 MED ORDER — PHENAZOPYRIDINE HCL 200 MG PO TABS: 200.0000 mg | ORAL_TABLET | Freq: Three times a day (TID) | ORAL | 0 refills | Status: DC | PRN

## 2022-10-14 MED ORDER — LIDOCAINE HCL (PF) 2 % IJ SOLN
INTRAMUSCULAR | Status: AC
  Filled 2022-10-14: qty 5

## 2022-10-14 MED ORDER — LACTATED RINGERS IV SOLN: INTRAVENOUS | Status: DC | PRN

## 2022-10-14 MED ORDER — FENTANYL CITRATE PF 50 MCG/ML IJ SOSY: 25.0000 ug | PREFILLED_SYRINGE | INTRAMUSCULAR | Status: DC | PRN

## 2022-10-14 MED ORDER — FENTANYL CITRATE (PF) 100 MCG/2ML IJ SOLN
INTRAMUSCULAR | Status: DC | PRN
  Administered 2022-10-14 (×2): 50 ug via INTRAVENOUS

## 2022-10-14 MED ORDER — SODIUM CHLORIDE 0.9 % IR SOLN
Status: DC | PRN
  Administered 2022-10-14: 6000 mL

## 2022-10-14 MED ORDER — ACETAMINOPHEN 160 MG/5ML PO SOLN: 325.0000 mg | ORAL | Status: DC | PRN

## 2022-10-14 MED ORDER — 0.9 % SODIUM CHLORIDE (POUR BTL) OPTIME
TOPICAL | Status: DC | PRN
  Administered 2022-10-14: 1000 mL

## 2022-10-14 MED ORDER — ACETAMINOPHEN 325 MG PO TABS: 325.0000 mg | ORAL_TABLET | ORAL | Status: DC | PRN

## 2022-10-14 MED ORDER — OXYCODONE HCL 5 MG PO TABS: 5.0000 mg | ORAL_TABLET | Freq: Once | ORAL | Status: DC | PRN

## 2022-10-14 SURGICAL SUPPLY — 10 items
GLOVE BIO SURGEON STRL SZ8 (GLOVE) IMPLANT
GLOVE BIOGEL PI IND STRL 7.5 (GLOVE) IMPLANT
GLOVE BIOGEL PI IND STRL 8 (GLOVE) IMPLANT
GLOVE ECLIPSE 8.0 STRL XLNG CF (GLOVE) IMPLANT
GOWN STRL REUS W/TWL XL LVL3 (GOWN DISPOSABLE) IMPLANT
PACK CYSTO (CUSTOM PROCEDURE TRAY) IMPLANT
SLEEVE SCD COMPRESS KNEE MED (STOCKING) IMPLANT
SLIPPER XL GRAY (MISCELLANEOUS) IMPLANT
TRAY FOLEY MTR SLVR 16FR STAT (SET/KITS/TRAYS/PACK) IMPLANT
TUBING UROLOGY SET (TUBING) IMPLANT

## 2022-10-14 NOTE — Anesthesia Procedure Notes (Signed)
Procedure Name: LMA Insertion Date/Time: 10/14/2022 3:28 AM  Performed by: Epimenio Sarin, CRNAPre-anesthesia Checklist: Patient identified, Emergency Drugs available, Suction available, Patient being monitored and Timeout performed Patient Re-evaluated:Patient Re-evaluated prior to induction Oxygen Delivery Method: Circle system utilized Preoxygenation: Pre-oxygenation with 100% oxygen Induction Type: IV induction LMA: LMA with gastric port inserted LMA Size: 4.0 Number of attempts: 1 Dental Injury: Teeth and Oropharynx as per pre-operative assessment

## 2022-10-14 NOTE — Anesthesia Postprocedure Evaluation (Signed)
Anesthesia Post Note  Patient: Brett Brown  Procedure(s) Performed: CYSTOSCOPY with removal of urethral foreign body     Patient location during evaluation: PACU Anesthesia Type: General Level of consciousness: awake and alert Pain management: pain level controlled Vital Signs Assessment: post-procedure vital signs reviewed and stable Respiratory status: spontaneous breathing, nonlabored ventilation, respiratory function stable and patient connected to nasal cannula oxygen Cardiovascular status: blood pressure returned to baseline and stable Postop Assessment: no apparent nausea or vomiting Anesthetic complications: no   No notable events documented.  Last Vitals:  Vitals:   10/14/22 0445 10/14/22 0504  BP: (!) 130/91 (!) 146/93  Pulse: 60 66  Resp: 14 18  Temp: 36.4 C 36.5 C  SpO2: 100% 98%    Last Pain:  Vitals:   10/14/22 0504  TempSrc:   PainSc: 0-No pain                 Daizee Firmin

## 2022-10-14 NOTE — ED Notes (Signed)
Bladder scan shows <50mL. 

## 2022-10-14 NOTE — ED Notes (Signed)
[facility] charge RN receives report. [place] MD accepts.

## 2022-10-14 NOTE — Op Note (Signed)
OPERATIVE NOTE   Patient Name: Brett Brown  MRN: [id number]   Date of Procedure: 10/14/22  Preoperative diagnosis:  Foreign body in urethra  Postoperative diagnosis:  Foreign body in urethra  Procedure:  Cystoscopy with removal of urethral foreign body  Attending: Milderd Meager, MD  Anesthesia: General  Estimated blood loss: minimal  Fluids: Per anesthesia record  Drains: 67F foley catheter  Specimens: Urethral foreign body  Antibiotics: Rocephin 1 gm IV  Findings: 5 cm silicone tip of enema bottle in anterior urethra; normal prostatic urethra and bladder  Indications:  42 YO male presents for cystoscopy and removal of urethral foreign body.  He inserted the tip of a enema bottle into his urethra yesterday.  He was unable to retrieve object.  He has voided once with pain and hematuria.  He presents for cystoscopy and removal of urethral foreign body.  Procedure and risks discussed with patient.  He understands and wishes to proceed.  Description of Procedure:  The patient received IV Rocephin preoperatively.  After successful induction of a general anesthetic, the patient was placed in the dorsolithotomy position.  The patient's genital area was prepped and draped in sterile fashion.  Palpation of the urethra demonstrated a foreign object in the anterior urethra.  I was unable to visualize the object at the meatus with gentle pressure on the proximal aspect of the object.  I performed flexible cystoscopy and visualized the silicone object within the distal urethra.  Using grasping forceps, I attempted to remove the object but was unsuccessful in manipulating it through the distal urethra and meatus.  After several attempts, I elected to gently dilate the urethral meatus to 26 [place].  A 22 French rigid cystoscope was then passed into the urethra.  Using grasping forceps, I attempted to grasp the object without success.  I carefully manipulated the object proximally  through the urethra and into the bladder.  I was then able to grasp the rounded end of the object with grasping forceps and pulled it into the cystoscope sheath.  The cystoscope and the foreign object were then completely removed.  Inspection demonstrated an intact silicone tip measuring 5 cm in length.  Inspection of the urethra and bladder demonstrated no other abnormalities.  A 16 French Foley catheter was placed with 10 mL of sterile water in the balloon.  The patient was then extubated and taken to the post anesthesia care unit in stable condition.  Complications: None  Condition: Stable, extubated, transferred to PACU  Plan:  D/C home with foley catheter Return to office in 4-5 days for voiding trial

## 2022-10-14 NOTE — Anesthesia Preprocedure Evaluation (Signed)
Anesthesia Evaluation  Patient identified by MRN, date of birth, ID band Patient awake    Reviewed: Allergy & Precautions, H&P , NPO status , Patient's Chart, lab work & pertinent test results  Airway Mallampati: I  TM Distance: >3 FB Neck ROM: Full    Dental no notable dental hx. (+) Teeth Intact, Dental Advisory Given   Pulmonary neg pulmonary ROS   Pulmonary exam normal breath sounds clear to auscultation       Cardiovascular Exercise Tolerance: Good hypertension, Pt. on medications negative cardio ROS Normal cardiovascular exam Rhythm:Regular Rate:Normal     Neuro/Psych  PSYCHIATRIC DISORDERS Anxiety     negative neurological ROS  negative psych ROS   GI/Hepatic negative GI ROS, Neg liver ROS,,,  Endo/Other  negative endocrine ROS    Renal/GU negative Renal ROS  negative genitourinary   Musculoskeletal negative musculoskeletal ROS (+)    Abdominal   Peds negative pediatric ROS (+) ADHD Hematology negative hematology ROS (+)   Anesthesia Other Findings   Reproductive/Obstetrics negative OB ROS                             Anesthesia Physical Anesthesia Plan  ASA: 2 and emergent  Anesthesia Plan: General   Post-op Pain Management: Minimal or no pain anticipated   Induction: Intravenous  PONV Risk Score and Plan: 2 and Ondansetron and Dexamethasone  Airway Management Planned: LMA and Oral ETT  Additional Equipment: None  Intra-op Plan:   Post-operative Plan: Extubation in OR  Informed Consent: I have reviewed the patients History and Physical, chart, labs and discussed the procedure including the risks, benefits and alternatives for the proposed anesthesia with the patient or authorized representative who has indicated his/her understanding and acceptance.       Plan Discussed with: CRNA, Anesthesiologist and Surgeon  Anesthesia Plan Comments: ( )        Anesthesia Quick Evaluation

## 2022-10-14 NOTE — ED Notes (Signed)
Pt calls family for POV transfer to [facility]. Not available at this time. Pt calls [employer] for transfer. ETA 15-20 min. Okay waiting in lobby for arrival. No PIV in place.

## 2022-10-14 NOTE — ED Provider Notes (Signed)
[facility] EMERGENCY DEPARTMENT AT [facility] Provider Note   CSN: [id number] Arrival date & time: 10/13/22  2059     History  No chief complaint on file.   Brett Brown is a 42 y.o. male.  Patient is a 42 year old male with history of ADHD.  Patient presenting with complaints of a foreign body in his penis.  He reports placing the tip of an enema into his penis earlier this evening.  He describes pain, but is able to void as the tip of the plastic is hollow.  The history is provided by the patient.       Home Medications Prior to Admission medications   Medication Sig Start Date End Date Taking? Authorizing Provider  allopurinol (ZYLOPRIM) 300 MG tablet Take 1 tablet (300 mg total) by mouth daily. MUST KEEP UPCOMING APPT ON 09/14/22 FOR ADD'L REFILLS. 08/19/22   Christen Butter, NP  amphetamine-dextroamphetamine (ADDERALL) 20 MG tablet Take 1 tablet (20 mg total) by mouth 2 (two) times daily. 07/18/22   Christen Butter, NP  amphetamine-dextroamphetamine (ADDERALL) 20 MG tablet Take 1 tablet (20 mg total) by mouth 2 (two) times daily. 06/18/22   Christen Butter, NP  amphetamine-dextroamphetamine (ADDERALL) 20 MG tablet Take 1 tablet (20 mg total) by mouth 2 (two) times daily. 09/14/22   Everrett Coombe, DO  colchicine 0.6 MG tablet TAKE 1 TABLET (0.6 MG TOTAL) BY MOUTH DAILY AS NEEDED (GOUT OR PSUEDOGOUT PAIN). 12/22/21   Rodolph Bong, MD  escitalopram (LEXAPRO) 10 MG tablet TAKE 1 TABLET BY MOUTH EVERY DAY MAY INCREASE TO 2 TABLETS IF NEEDED 12/08/21   Christen Butter, NP  ibuprofen (ADVIL) 200 MG tablet Take 3 tablets (600 mg total) by mouth every 6 (six) hours. 03/29/18   Mack Hook, MD  tadalafil (CIALIS) 20 MG tablet Take 0.5-1 tablets (10-20 mg total) by mouth every other day as needed for erectile dysfunction. 03/16/22   Christen Butter, NP      Allergies    Patient has no known allergies.    Review of Systems   Review of Systems  All other systems reviewed and are  negative.   Physical Exam Updated Vital Signs BP (!) 139/109 (BP Location: Right Arm)   Pulse 87   Temp 97.7 F (36.5 C)   Resp 18   SpO2 98%  Physical Exam Vitals and nursing note reviewed.  Pulmonary:     Effort: Pulmonary effort is normal.  Genitourinary:    Comments: There is a foreign body palpable within the urethra, however is not visible through the meatus.  Exam is somewhat limited secondary to pain. Skin:    General: Skin is warm and dry.  Neurological:     Mental Status: He is alert.     ED Results / Procedures / Treatments   Labs (all labs ordered are listed, but only abnormal results are displayed) Labs Reviewed - No data to display  EKG None  Radiology No results found.  Procedures Procedures    Medications Ordered in ED Medications - No data to display  ED Course/ Medical Decision Making/ A&P  Patient presenting with a foreign object in his penis as described in the HPI.  I am unable to visualize this object, but cannot palpate it.  Patient care discussed with Dr. Pete Glatter from urology.  He has asked that I transfer the patient from here to [facility] for cystoscopy and foreign body removal.  Dr. Eudelia Bunch accepts in transfer.  Final Clinical Impression(s) / ED  Diagnoses Final diagnoses:  Foreign body in penis, initial encounter    Rx / DC Orders ED Discharge Orders     None         Geoffery Lyons, MD 10/14/22 0030

## 2022-10-14 NOTE — Transfer of Care (Signed)
Immediate Anesthesia Transfer of Care Note  Patient: Brett Brown  Procedure(s) Performed: CYSTOSCOPY with removal of urethral foreign body  Patient Location: PACU  Anesthesia Type:General  Level of Consciousness: drowsy and patient cooperative  Airway & Oxygen Therapy: Patient Spontanous Breathing and Patient connected to face mask oxygen  Post-op Assessment: Report given to RN and Post -op Vital signs reviewed and stable  Post vital signs: Reviewed and stable  Last Vitals:  Vitals Value Taken Time  BP    Temp    Pulse 69 10/14/22 0413  Resp 15 10/14/22 0413  SpO2 100 % 10/14/22 0413  Vitals shown include unvalidated device data.  Last Pain:  Vitals:   10/14/22 0133  TempSrc: Oral  PainSc:          Complications: No notable events documented.

## 2022-10-14 NOTE — ED Provider Notes (Signed)
Received patient care from recent transfer from [facility] ER at [facility].  Pt was last seen by ER attending Dr. Judd Lien earlier tonight.  Pt insert a fb into the tip of his penis earlier in the night and unable to retrieve it.  He is having pain and is being sent here to be evaluated by urologist Dr. Pete Glatter.    On examination with chaperone present, circumcised penis free of lesion or rash.  I am able to palpate a foreign body likely in the urethra without able to visualize it.  I have reached out to urologist, Dr. Pete Glatter to notify him of patient's status.  He will see pt in the ER and determine disposition.   BP (!) 144/99 (BP Location: Left Arm)   Pulse 68   Temp 98 F (36.7 C) (Oral)   Resp 20   SpO2 100%   Results for orders placed or performed in visit on 02/26/21  Uric acid  Result Value Ref Range   Uric Acid, Serum 8.0 (H) 4.0 - 7.8 mg/dL   No results found.    Fayrene Helper, PA-C 10/14/22 1610    Nira Conn, MD 10/14/22 [phone]

## 2022-10-14 NOTE — H&P (Signed)
Urology Consult   Physician requesting consult: Geoffery Lyons, MD  Reason for consult: Urethral foreign body  History of Present Illness: Brett Brown is a 42 y.o. male seen for evaluation of a urethral foreign body.  He inserted the tip of an enema bottle into his urethra yesterday morning.  He was unable to retrieve the object after placement.  He stated he did this to "help get an erection".  He has voided once since the event.  He had dysuria and gross hematuria.  Bladder scan in ED showed a volume of 50 ml in bladder.  He denies placing any other objects into the urethra.  He is having pain in the urethra and penis.  He denies a history of voiding or storage urinary symptoms, hematuria, UTIs, STDs, urolithiasis, GU malignancy/trauma/surgery.  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 He has never used smokeless tobacco. He reports that he does not drink alcohol and does not use drugs.  ROS: A complete review of systems was performed.  All systems are negative except for pertinent findings as noted.  Physical Exam:  Vital signs in last 24 hours: Temp:  [97.7 F (36.5 C)-98 F (36.7 C)] 98 F (36.7 C) (05/30 0133) Pulse Rate:  [72-87] 72 (05/30 0133) Resp:  [18] 18 (05/30 0133) BP: (139-143)/(99-109) 143/99 (05/30 0133) SpO2:  [98 %-100 %] 100 % (05/30 0133) GENERAL APPEARANCE:  Well  appearing, well developed, well nourished, NAD HEENT:  Atraumatic, normocephalic, oropharynx clear NECK:  Supple without lymphadenopathy or thyromegaly CHEST:  Clear bilaterally CV:  RRR ABDOMEN:  Soft, non-tender, no masses EXTREMITIES:  Moves all extremities well, without clubbing, cyanosis, or edema NEUROLOGIC:  Alert and oriented x 3, CN II-XII grossly intact MENTAL STATUS:  appropriate BACK:  Non-tender to palpation, No CVAT SKIN:  Warm, dry, and intact GU:  circumcised phallus; no blood or foreign body seen at meatus; object palpated in anterior urethra, approximately 3-4 cm in length; scrotum without erythema or edema; testicles descended, NT bilaterally  Laboratory Data:  None  Radiologic Imaging: None   Impression/Recommendation Foreign body in urethra  Recommend surgical management with cystoscopy and removal of foreign body from urethra.  Procedure and risks discussed with patient.  He understands and wishes to proceed. I discussed potential risks of urethral stricture formation, failure to remove the object, need for additional procedures, and anesthetic risks.  Procedure: The patient will be scheduled for cystoscopy and removal of foreign body from urethra at [facility].   Informed consent is given as documented below. Anesthesia: General  The patient does not have sleep apnea, history of MRSA, history of VRE, history of cardiac device requiring special anesthetic needs.  Consent for Operation or Procedure: Provider Certification I hereby certify that the  nature, purpose, benefits, usual and most frequent risks of, and alternatives to, the operation or procedure have been explained to the patient (or person authorized to sign for the patient) either by me as responsible physician or by the provider who is to perform the operation or procedure. Time spent such that the patient/family has had an opportunity to ask questions, and that those questions have been answered.  The patient or the patient's representative has been advised that selected tasks may be performed by assistants to the primary health care provider(s). I believe that the patient (or person authorized to sign for the patient) understands what has been explained, and has consented to the operation or procedure. No guarantees were implied or made.   Di Kindle 10/14/2022, 2:23 AM

## 2022-10-15 ENCOUNTER — Encounter (HOSPITAL_COMMUNITY): Payer: Self-pay | Admitting: Urology

## 2022-10-18 ENCOUNTER — Encounter: Payer: Self-pay | Admitting: Urology

## 2022-10-18 ENCOUNTER — Ambulatory Visit: Payer: Federal, State, Local not specified - PPO | Admitting: Urology

## 2022-10-18 VITALS — BP 139/91 | HR 128 | Ht 74.0 in | Wt 240.0 lb

## 2022-10-18 DIAGNOSIS — T194XXD Foreign body in penis, subsequent encounter: Secondary | ICD-10-CM

## 2022-10-18 NOTE — Progress Notes (Signed)
Fill and Pull Catheter Removal  Patient is present today for a catheter removal.  Patient was cleaned and prepped in a sterile fashion of sterile water/ saline was instilled into the bladder when the patient felt the urge to urinate, 9mL of water was then drained from the balloon.  A 16FR foley cath was removed from the bladder no complications were noted .  Patient was then given some time to void on their own.  Patient can void  on their own after some time.  Patient tolerated well.  Performed by: Jimmye Norman. CMA

## 2022-10-18 NOTE — Progress Notes (Signed)
   Assessment: 1. Foreign body in penis, subsequent encounter     Plan: Foley removed today after successful voiding trial Complete antibiotics Return to office in 2 weeks with bladder scan  Chief Complaint: Chief Complaint  Patient presents with   Urinary Retention    HPI: Brett Brown is a 42 y.o. male who presents for continued evaluation of urethral foreign body. He underwent cystoscopy with removal of a urethral foreign body on 10/14/2022.  He was discharged with a Foley catheter in place.  He presents today for a voiding trial.  Portions of the above documentation were copied from a prior visit for review purposes only.  Allergies: No Known Allergies  PMH: Past Medical History:  Diagnosis Date   ADHD    Hypertension     PSH: Past Surgical History:  Procedure Laterality Date   CYSTOSCOPY N/A 10/14/2022   Procedure: CYSTOSCOPY with removal of urethral foreign body;  Surgeon: Milderd Meager., MD;  Location: [facility];  Service: Urology;  Laterality: N/A;   ROTATOR CUFF REPAIR Right 2018   thumb surg near amputation retached Right 03/29/2018   [facility]    SH: Social History   Tobacco Use   Smoking status: Never   Smokeless tobacco: Never  Vaping Use   Vaping Use: Never used  Substance Use Topics   Alcohol use: No   Drug use: No    ROS: Constitutional:  Negative for fever, chills, weight loss CV: Negative for chest pain, previous MI, hypertension Respiratory:  Negative for shortness of breath, wheezing, sleep apnea, frequent cough GI:  Negative for nausea, vomiting, bloody stool, GERD  PE: There were no vitals taken for this visit. GENERAL APPEARANCE:  Well appearing, well developed, well nourished, NAD HEENT:  Atraumatic, normocephalic, oropharynx clear NECK:  Supple without lymphadenopathy or thyromegaly ABDOMEN:  Soft, non-tender, no masses EXTREMITIES:  Moves all extremities well, without clubbing, cyanosis, or  edema NEUROLOGIC:  Alert and oriented x 3, normal gait, CN II-XII grossly intact MENTAL STATUS:  appropriate BACK:  Non-tender to palpation, No CVAT SKIN:  Warm, dry, and intact   Results: None  Procedure:  VOIDING TRIAL  A voiding trial was performed in the office today.   Volume of sterile water instilled: 180 mL Foley catheter removed intact. Volume voided by patient: 180 mL Instructed to return to office if has not voided by 4 PM

## 2022-11-01 ENCOUNTER — Ambulatory Visit: Payer: Federal, State, Local not specified - PPO | Admitting: Urology

## 2022-11-01 ENCOUNTER — Encounter: Payer: Self-pay | Admitting: Urology

## 2022-11-01 VITALS — BP 137/93 | HR 80 | Ht 74.0 in | Wt 235.0 lb

## 2022-11-01 DIAGNOSIS — Z09 Encounter for follow-up examination after completed treatment for conditions other than malignant neoplasm: Secondary | ICD-10-CM | POA: Diagnosis not present

## 2022-11-01 DIAGNOSIS — T194XXD Foreign body in penis, subsequent encounter: Secondary | ICD-10-CM | POA: Diagnosis not present

## 2022-11-01 LAB — URINALYSIS, ROUTINE W REFLEX MICROSCOPIC
Bilirubin, UA: NEGATIVE
Glucose, UA: NEGATIVE
Ketones, UA: NEGATIVE
Leukocytes,UA: NEGATIVE
Nitrite, UA: NEGATIVE
Protein,UA: NEGATIVE
RBC, UA: NEGATIVE
Specific Gravity, UA: 1.03 (ref 1.005–1.030)
Urobilinogen, Ur: 0.2 mg/dL (ref 0.2–1.0)
pH, UA: 7 (ref 5.0–7.5)

## 2022-11-01 LAB — BLADDER SCAN AMB NON-IMAGING

## 2022-11-01 NOTE — Progress Notes (Signed)
   Assessment: 1. Foreign body in penis, subsequent encounter     Plan: Return to office prn  Chief Complaint: Chief Complaint  Patient presents with   urethral foreign body    HPI: Brett Brown is a 42 y.o. male who presents for continued evaluation of urethral foreign body. He underwent cystoscopy with removal of a urethral foreign body on 10/14/2022.  He was discharged with a Foley catheter in place.   He passed a voiding trial on 10/18/22. He returned today for follow-up.  He is voiding without difficulty.  No dysuria or gross hematuria.  He reports a good stream.  Portions of the above documentation were copied from a prior visit for review purposes only.  Allergies: No Known Allergies  PMH: Past Medical History:  Diagnosis Date   ADHD    Hypertension     PSH: Past Surgical History:  Procedure Laterality Date   CYSTOSCOPY N/A 10/14/2022   Procedure: CYSTOSCOPY with removal of urethral foreign body;  Surgeon: Milderd Meager., MD;  Location: [facility];  Service: Urology;  Laterality: N/A;   ROTATOR CUFF REPAIR Right 2018   thumb surg near amputation retached Right 03/29/2018   [facility]    SH: Social History   Tobacco Use   Smoking status: Never   Smokeless tobacco: Never  Vaping Use   Vaping Use: Never used  Substance Use Topics   Alcohol use: No   Drug use: No    ROS: Constitutional:  Negative for fever, chills, weight loss CV: Negative for chest pain, previous MI, hypertension Respiratory:  Negative for shortness of breath, wheezing, sleep apnea, frequent cough GI:  Negative for nausea, vomiting, bloody stool, GERD  PE: BP (!) 137/93   Pulse 80   Ht 6\' 2"  (1.88 m)   Wt 235 lb (106.6 kg)   BMI 30.17 kg/m  GENERAL APPEARANCE:  Well appearing, well developed, well nourished, NAD HEENT:  Atraumatic, normocephalic, oropharynx clear NECK:  Supple without lymphadenopathy or thyromegaly ABDOMEN:  Soft, non-tender, no  masses EXTREMITIES:  Moves all extremities well, without clubbing, cyanosis, or edema NEUROLOGIC:  Alert and oriented x 3, normal gait, CN II-XII grossly intact MENTAL STATUS:  appropriate BACK:  Non-tender to palpation, No CVAT SKIN:  Warm, dry, and intact   Results: U/A: Negative  PVR: 0 mL

## 2022-12-30 ENCOUNTER — Other Ambulatory Visit: Payer: Self-pay | Admitting: Family Medicine

## 2022-12-31 MED ORDER — AMPHETAMINE-DEXTROAMPHETAMINE 20 MG PO TABS: 20.0000 mg | ORAL_TABLET | Freq: Two times a day (BID) | ORAL | 0 refills | Status: DC

## 2022-12-31 NOTE — Telephone Encounter (Signed)
Sent!

## 2023-01-30 ENCOUNTER — Other Ambulatory Visit: Payer: Self-pay | Admitting: Medical-Surgical

## 2023-02-01 NOTE — Telephone Encounter (Signed)
3 month supply picked up on 01/30/2023 per PDMP report.

## 2023-03-16 ENCOUNTER — Ambulatory Visit: Payer: Federal, State, Local not specified - PPO | Admitting: Medical-Surgical

## 2023-03-16 ENCOUNTER — Encounter: Payer: Self-pay | Admitting: Medical-Surgical

## 2023-03-16 VITALS — BP 110/69 | HR 85 | Resp 20 | Ht 74.0 in | Wt 229.0 lb

## 2023-03-16 DIAGNOSIS — E663 Overweight: Secondary | ICD-10-CM

## 2023-03-16 DIAGNOSIS — E291 Testicular hypofunction: Secondary | ICD-10-CM

## 2023-03-16 DIAGNOSIS — I10 Essential (primary) hypertension: Secondary | ICD-10-CM

## 2023-03-16 DIAGNOSIS — N522 Drug-induced erectile dysfunction: Secondary | ICD-10-CM

## 2023-03-16 DIAGNOSIS — F902 Attention-deficit hyperactivity disorder, combined type: Secondary | ICD-10-CM | POA: Diagnosis not present

## 2023-03-16 DIAGNOSIS — F411 Generalized anxiety disorder: Secondary | ICD-10-CM | POA: Diagnosis not present

## 2023-03-16 DIAGNOSIS — M1A00X0 Idiopathic chronic gout, unspecified site, without tophus (tophi): Secondary | ICD-10-CM

## 2023-03-16 MED ORDER — AMPHETAMINE-DEXTROAMPHETAMINE 20 MG PO TABS: 20.0000 mg | ORAL_TABLET | Freq: Two times a day (BID) | ORAL | 0 refills | Status: DC

## 2023-03-16 MED ORDER — TADALAFIL 20 MG PO TABS: 10.0000 mg | ORAL_TABLET | ORAL | 11 refills | Status: AC | PRN

## 2023-03-16 MED ORDER — ALLOPURINOL 300 MG PO TABS: 300.0000 mg | ORAL_TABLET | Freq: Every day | ORAL | 3 refills | Status: AC

## 2023-03-16 NOTE — Progress Notes (Signed)
        Established patient visit  History, exam, impression, and plan:  1. Primary hypertension Very pleasant 42 year old male presenting today with a history of hypertension.  He was previously 320 pounds at his heaviest weight however he was bodybuilding and reports his body fat percentage was approximately 25% at the time.  He had difficulty with high blood pressure but he has since worked on weight loss to a healthy weight and is following a low-sodium heart healthy diet with regular intentional exercise.  Blood pressure looks fabulous today without medications.  Continue dietary/lifestyle modifications.  Checking labs as below. - CBC - CMP14+EGFR - Lipid panel  2. Attention deficit hyperactivity disorder (ADHD), combined type Long history of ADHD that has been stable on Adderall instant release 20 mg twice daily.  Tolerating the medication well without side effects.  Sleeping well and has not noted any weight fluctuation or appetite suppression related to the medication.  Continue Adderall 20 mg twice daily.  3. GAD (generalized anxiety disorder) Previously tried Lexapro 10 mg daily but reports that the medication was not well-tolerated after a couple of months.  Has been doing well overall with mood management and does not feel he needs any intervention at this time.  PHQ-9/GAD-7 scores less than 4.  Denies SI/HI.  4. Drug-induced erectile dysfunction Taking Cialis 20 mg daily as needed, tolerating well without side effects.  Feels the medication still works well for him although he has had to switch over to sildenafil in the past for short period of time before switching back.  Continue Cialis as prescribed.  5. Idiopathic chronic gout without tophus, unspecified site History of idiopathic gout previously treated with allopurinol 300 mg daily with as needed colchicine.  He has not been taking either of these medications regularly but has been very careful with his diet so that he avoids  food triggers.  Notes that he takes the allopurinol when he has flares and uses colchicine as a rescue for acute issues.  Discussed that allopurinol should be taken daily as a preventative rather than used for flares.  Refills sent and patient reports that he will try to take the allopurinol regularly.  6. Overweight According to his BMI, he is still overweight however he is in excellent physical shape with good muscle mass.  No concerns with weight at this time.  7. Testosterone deficiency in male History of testosterone deficiency.  Has not been checked in a while so checking today.  Previously tried testosterone replacement however the fertility clinic reported that this can reduce sperm count so he stopped the HRT. - Testosterone   Procedures performed this visit: None.  Return in about 6 months (around 09/14/2023) for ADHD follow up.  __________________________________ Thayer Ohm, DNP, APRN, FNP-BC Primary Care and Sports Medicine [facility] [place]

## 2023-03-17 LAB — CMP14+EGFR
ALT: 18 [IU]/L (ref 0–44)
AST: 15 [IU]/L (ref 0–40)
Albumin: 4.5 g/dL (ref 4.1–5.1)
Alkaline Phosphatase: 89 [IU]/L (ref 44–121)
BUN/Creatinine Ratio: 10 (ref 9–20)
BUN: 11 mg/dL (ref 6–24)
Bilirubin Total: 0.3 mg/dL (ref 0.0–1.2)
CO2: 23 mmol/L (ref 20–29)
Calcium: 9.1 mg/dL (ref 8.7–10.2)
Chloride: 102 mmol/L (ref 96–106)
Creatinine, Ser: 1.08 mg/dL (ref 0.76–1.27)
Globulin, Total: 1.8 g/dL (ref 1.5–4.5)
Glucose: 86 mg/dL (ref 70–99)
Potassium: 3.8 mmol/L (ref 3.5–5.2)
Sodium: 141 mmol/L (ref 134–144)
Total Protein: 6.3 g/dL (ref 6.0–8.5)
eGFR: 87 mL/min/{1.73_m2} (ref >59)

## 2023-03-17 LAB — CBC
Hematocrit: 44 % (ref 37.5–51.0)
Hemoglobin: 14.6 g/dL (ref 13.0–17.7)
MCH: 30.5 pg (ref 26.6–33.0)
MCHC: 33.2 g/dL (ref 31.5–35.7)
MCV: 92 fL (ref 79–97)
Platelets: 301 10*3/uL (ref 150–450)
RBC: 4.79 x10E6/uL (ref 4.14–5.80)
RDW: 11.9 % (ref 11.6–15.4)
WBC: 8.2 10*3/uL (ref 3.4–10.8)

## 2023-03-17 LAB — LIPID PANEL
Chol/HDL Ratio: 3.2 ratio (ref 0.0–5.0)
Cholesterol, Total: 144 mg/dL (ref 100–199)
HDL: 45 mg/dL (ref >39)
LDL Chol Calc (NIH): 75 mg/dL (ref 0–99)
Triglycerides: 137 mg/dL (ref 0–149)
VLDL Cholesterol Cal: 24 mg/dL (ref 5–40)

## 2023-03-17 LAB — TESTOSTERONE: Testosterone: 319 ng/dL (ref 264–916)

## 2023-05-03 ENCOUNTER — Encounter: Payer: Self-pay | Admitting: Medical-Surgical

## 2023-05-03 ENCOUNTER — Other Ambulatory Visit: Payer: Self-pay | Admitting: Family Medicine

## 2023-05-04 MED ORDER — AMPHETAMINE-DEXTROAMPHETAMINE 20 MG PO TABS: 20.0000 mg | ORAL_TABLET | Freq: Two times a day (BID) | ORAL | 0 refills | Status: DC

## 2023-05-04 NOTE — Telephone Encounter (Signed)
Last fill 03/31/23 last visit 03/16/23 upcoming visit 09/12/23

## 2023-08-02 ENCOUNTER — Encounter: Payer: Self-pay | Admitting: Medical-Surgical

## 2023-08-02 MED ORDER — AMPHETAMINE-DEXTROAMPHETAMINE 20 MG PO TABS: 20.0000 mg | ORAL_TABLET | Freq: Two times a day (BID) | ORAL | 0 refills | Status: DC

## 2023-08-02 NOTE — Telephone Encounter (Signed)
 Could you send for 90 day supply please?

## 2023-08-05 ENCOUNTER — Telehealth: Payer: Self-pay

## 2023-08-05 NOTE — Telephone Encounter (Signed)
 Copied from CRM [id number]. Topic: Clinical - Prescription Issue >> Aug 05, 2023 12:49 PM Shelah Lewandowsky wrote: Reason for CRM: amphetamine-dextroamphetamine (ADDERALL) 20 MG tablet- need prior auth per pharmacy

## 2023-08-11 ENCOUNTER — Telehealth: Payer: Self-pay

## 2023-08-11 ENCOUNTER — Other Ambulatory Visit (HOSPITAL_COMMUNITY): Payer: Self-pay

## 2023-08-11 NOTE — Telephone Encounter (Signed)
 Pharmacy Patient Advocate Encounter   Received notification from Pt Calls Messages that prior authorization for Adderall 20 is required/requested.   Insurance verification completed.   The patient is insured through [employer] .   Per test claim: PA required; PA submitted to above mentioned insurance via CoverMyMeds Key/confirmation #/EOC [id number] Status is pending

## 2023-08-11 NOTE — Telephone Encounter (Signed)
 Pharmacy Patient Advocate Encounter  Received notification from [facility] that Prior Authorization for Adderall 20 has been APPROVED from 07/12/23 to 08/10/24. Ran test claim, Copay is $15.00. This test claim was processed through [facility]- copay amounts may vary at other pharmacies due to pharmacy/plan contracts, or as the patient moves through the different stages of their insurance plan.   PA #/Case ID/Reference #:  [id number]

## 2023-08-11 NOTE — Telephone Encounter (Signed)
 Noted. A MyChart message was sent to the patient regarding the current status.

## 2023-08-11 NOTE — Telephone Encounter (Signed)
 Patient notified via MyChart message of the approval for Adderall.

## 2023-08-12 ENCOUNTER — Other Ambulatory Visit (HOSPITAL_COMMUNITY): Payer: Self-pay

## 2023-08-16 ENCOUNTER — Other Ambulatory Visit (HOSPITAL_COMMUNITY): Payer: Self-pay

## 2023-08-16 NOTE — Telephone Encounter (Signed)
 Pharmacy Patient Advocate Encounter  Received notification from [facility] that Prior Authorization for Adderall 20 has been APPROVED from 08/11/23 to 08/10/24. Ran test claim, Copay is $15.00. This test claim was processed through [facility]- copay amounts may vary at other pharmacies due to pharmacy/plan contracts, or as the patient moves through the different stages of their insurance plan.   PA #/Case ID/Reference #: [id number]

## 2023-09-11 NOTE — Progress Notes (Deleted)
   Established patient visit  History, exam, impression, and plan:  No problem-specific Assessment & Plan notes found for this encounter.   ROS  Physical Exam  Procedures performed this visit: None.  No follow-ups on file.  __________________________________ Thayer Ohm, DNP, APRN, FNP-BC Primary Care and Sports Medicine [facility] [place]

## 2023-09-12 ENCOUNTER — Ambulatory Visit: Payer: Federal, State, Local not specified - PPO | Admitting: Medical-Surgical

## 2023-09-12 DIAGNOSIS — M1A00X0 Idiopathic chronic gout, unspecified site, without tophus (tophi): Secondary | ICD-10-CM

## 2023-09-12 DIAGNOSIS — F902 Attention-deficit hyperactivity disorder, combined type: Secondary | ICD-10-CM

## 2023-09-12 DIAGNOSIS — I10 Essential (primary) hypertension: Secondary | ICD-10-CM

## 2023-10-03 ENCOUNTER — Encounter: Payer: Self-pay | Admitting: Medical-Surgical

## 2023-10-03 ENCOUNTER — Ambulatory Visit: Admitting: Medical-Surgical

## 2023-10-03 VITALS — BP 119/75 | HR 117 | Resp 20 | Ht 74.0 in | Wt 216.0 lb

## 2023-10-03 DIAGNOSIS — S70361A Insect bite (nonvenomous), right thigh, initial encounter: Secondary | ICD-10-CM

## 2023-10-03 DIAGNOSIS — K1121 Acute sialoadenitis: Secondary | ICD-10-CM

## 2023-10-03 DIAGNOSIS — F411 Generalized anxiety disorder: Secondary | ICD-10-CM

## 2023-10-03 DIAGNOSIS — W57XXXA Bitten or stung by nonvenomous insect and other nonvenomous arthropods, initial encounter: Secondary | ICD-10-CM | POA: Diagnosis not present

## 2023-10-03 MED ORDER — BUSPIRONE HCL 5 MG PO TABS: 5.0000 mg | ORAL_TABLET | Freq: Two times a day (BID) | ORAL | 0 refills | Status: DC

## 2023-10-03 MED ORDER — AMOXICILLIN-POT CLAVULANATE 875-125 MG PO TABS: 1.0000 | ORAL_TABLET | Freq: Two times a day (BID) | ORAL | 0 refills | Status: DC

## 2023-10-03 NOTE — Progress Notes (Signed)
        Established patient visit  History, exam, impression, and plan:  1. GAD (generalized anxiety disorder) (Primary) Pleasant 43 year old male presenting today to discuss a lifelong history of anxiety that has been worsening over the last two years. Often wakes up feeling severely anxious without a known cause. Denies getting SOB or having palpitations. Instead, he feels overstimulated like his skin is crawling. When feeling this way, he tends to shut down and just sleep. Using exercise and distraction but this is often not enough. Denies SI/HI. He is interested in counseling and would prefer a male provider. Also open to medications to help but would prefer not to take something daily. Discussed recommendations for long term management with a daily medication given the frequency of his symptoms but he remains hesitant after a bad experience with Lexapro . Reviewed options for PRN medications. Plan to start BuSpar  5-15mg  twice daily as needed. Patient aware that the med works best when taken scheduled but can be PRN. Referral placed for counseling.  - Ambulatory referral to Behavioral Health  2. Tick bite of right thigh, initial encounter Reports that he was bitten by a very small tick on the anterior right thigh about 1 week ago. The tick was attached for less than 30 minutes and he was able to remove it in it's entirety. No redness, swelling, drainage, or rash at the site. No fevers, joint pains, malaise, or rashes to palms/soles. Small intact brown scab approximately 2mm in size on the anterior right thigh, healing well. No intervention at this time. Monitor for concerning symptoms and return for further evaluation if they occur.   3. Acute parotitis Has had 2 days of severe left ear pain radiating down the left neck and into the jaw. Also notes tooth pain in his back teeth on the left side. No fevers, chills, sinus congestion, sore throat, cough, SOB, CP, or GI symptoms. Endorses a bad/metallic  taste in his mouth over the last couple of days. Suffers from dry mouth at times. On exam, his ears show erythema to the canals but TMs look ok. Mild lymphadenopathy along the left neck. Swelling and tenderness over the TMJ and into the cheek. Suspect parotitis, treating with Augmentin . Ok to use tylenol , ibuprofen , heat, ice, etc as needed.   Procedures performed this visit: None.  Return in about 5 weeks (around 11/07/2023) for mood follow up.  __________________________________ Maryl Snook, DNP, APRN, FNP-BC Primary Care and Sports Medicine [facility] [place]

## 2023-10-17 ENCOUNTER — Encounter: Payer: Self-pay | Admitting: Medical-Surgical

## 2023-10-17 NOTE — Telephone Encounter (Signed)
 [facility] Address: [address], [place], [place] [address] Phone: [phone]

## 2023-10-25 ENCOUNTER — Other Ambulatory Visit: Payer: Self-pay | Admitting: Medical-Surgical

## 2023-10-25 ENCOUNTER — Other Ambulatory Visit: Payer: Self-pay

## 2023-10-30 ENCOUNTER — Other Ambulatory Visit: Payer: Self-pay | Admitting: Medical-Surgical

## 2023-11-02 ENCOUNTER — Encounter: Payer: Self-pay | Admitting: Medical-Surgical

## 2023-11-03 ENCOUNTER — Other Ambulatory Visit: Payer: Self-pay | Admitting: Medical-Surgical

## 2023-11-03 MED ORDER — AMPHETAMINE-DEXTROAMPHETAMINE 20 MG PO TABS: 20.0000 mg | ORAL_TABLET | Freq: Two times a day (BID) | ORAL | 0 refills | Status: DC

## 2023-11-03 NOTE — Telephone Encounter (Signed)
 Forwarding message to Dr. Augustus Ledger covering for Brett Brown Requesting rx rf of Adderall 20mg  tablet Last written 08/02/2023 Last OV 10/03/2023 Upcoming appt =11/14/2023

## 2023-11-03 NOTE — Telephone Encounter (Unsigned)
 Copied from CRM [id number]. Topic: Clinical - Medication Refill >> Nov 03, 2023  4:33 PM Magdalene School wrote: Medication: amphetamine -dextroamphetamine  (ADDERALL) 20 MG tablet  Has the patient contacted their pharmacy? No (Agent: If no, request that the patient contact the pharmacy for the refill. If patient does not wish to contact the pharmacy document the reason why and proceed with request.) (Agent: If yes, when and what did the pharmacy advise?)  This is the patient's preferred pharmacy:  [id number] [place] [place] - [address] [place] [address] Phone: [phone] Fax: [phone]  Is this the correct pharmacy for this prescription? Yes If no, delete pharmacy and type the correct one.   Has the prescription been filled recently? No  Is the patient out of the medication? Yes  Has the patient been seen for an appointment in the last year OR does the patient have an upcoming appointment? Yes  Can we respond through MyChart? Yes  Agent: Please be advised that Rx refills may take up to 3 business days. We ask that you follow-up with your pharmacy.

## 2023-11-14 ENCOUNTER — Ambulatory Visit: Admitting: Medical-Surgical

## 2023-11-25 ENCOUNTER — Other Ambulatory Visit: Payer: Self-pay | Admitting: Medical-Surgical

## 2023-11-29 ENCOUNTER — Other Ambulatory Visit: Payer: Self-pay | Admitting: Medical-Surgical

## 2023-11-29 ENCOUNTER — Encounter: Payer: Self-pay | Admitting: Medical-Surgical

## 2023-11-29 ENCOUNTER — Ambulatory Visit: Admitting: Medical-Surgical

## 2023-11-29 VITALS — BP 124/72 | HR 87 | Resp 20 | Ht 74.0 in | Wt 212.0 lb

## 2023-11-29 DIAGNOSIS — F411 Generalized anxiety disorder: Secondary | ICD-10-CM | POA: Diagnosis not present

## 2023-11-29 MED ORDER — AMPHETAMINE-DEXTROAMPHETAMINE 30 MG PO TABS: 30.0000 mg | ORAL_TABLET | Freq: Every day | ORAL | 0 refills | Status: DC

## 2023-11-29 NOTE — Progress Notes (Signed)
        Established patient visit  Discussed the use of AI scribe software for clinical note transcription with the patient, who gave verbal consent to proceed.  History of Present Illness   The patient presents for a mood follow-up and evaluation of ADHD medication efficacy.  Mood stability - Buspar  10 mg twice daily stabilizes mood when taken regularly.  Attention deficit and focus issues - Adderall 20 mg twice daily has decreased in effectiveness over the past year. - Increased frequency of focus and control issues, now occurring about three days per week. - No sleep disturbances related to ADHD medication, as it is taken early in the day.  Behavioral health support - Seeking counseling services. - Prefers in-person counseling sessions over virtual ones.      Physical Exam Vitals and nursing note reviewed.  Constitutional:      General: He is not in acute distress.    Appearance: Normal appearance.  HENT:     Head: Normocephalic and atraumatic.  Cardiovascular:     Rate and Rhythm: Normal rate and regular rhythm.     Pulses: Normal pulses.     Heart sounds: Normal heart sounds. No murmur heard.    No friction rub. No gallop.  Pulmonary:     Effort: Pulmonary effort is normal. No respiratory distress.     Breath sounds: Normal breath sounds.  Skin:    General: Skin is warm and dry.  Neurological:     Mental Status: He is alert and oriented to person, place, and time.  Psychiatric:        Mood and Affect: Mood normal.        Behavior: Behavior normal.        Thought Content: Thought content normal.        Judgment: Judgment normal.     Assessment and Plan    Attention-Deficit/Hyperactivity Disorder (ADHD) Adderall 20 mg twice daily was less effective, with focus issues three days a week. No sleep issues when taken early. - Prescribe Adderall 30 mg twice daily for one month. - Advise taking one and a half of the 20 mg tablets twice daily if needed until he is able  to pick up the higher dose. - Discuss potential pharmacy restrictions on dispensing the new dosage.  Generalized Anxiety Disorder Scheduled dosing of Buspar  improved symptoms. Prefers single 10 mg tablet to reduce pill burden. - Prescribe Buspar  10 mg tablets, to be taken twice daily. - [facility].  Counseling and Psychological Support Prefers in-person counseling for non-verbal cues and trust.  - Encourage continued search for a suitable counselor and inform if a specific referral is needed. - Discuss option of in-house male counselors, specifically Nathanel Collet, if interested.      Return in about 4 weeks (around 12/27/2023) for ADHD follow up (in person or virtual).  __________________________________ Zada FREDRIK Palin, DNP, APRN, FNP-BC Primary Care and Sports Medicine [facility] [place]

## 2023-12-23 ENCOUNTER — Other Ambulatory Visit: Payer: Self-pay | Admitting: Medical-Surgical

## 2023-12-27 ENCOUNTER — Encounter (INDEPENDENT_AMBULATORY_CARE_PROVIDER_SITE_OTHER): Payer: Self-pay | Admitting: Medical-Surgical

## 2023-12-27 DIAGNOSIS — Z91199 Patient's noncompliance with other medical treatment and regimen due to unspecified reason: Secondary | ICD-10-CM

## 2023-12-27 NOTE — Progress Notes (Signed)
 No show for appointment.

## 2024-02-18 DIAGNOSIS — M5416 Radiculopathy, lumbar region: Secondary | ICD-10-CM | POA: Diagnosis not present

## 2024-02-18 DIAGNOSIS — M62830 Muscle spasm of back: Secondary | ICD-10-CM | POA: Diagnosis not present

## 2024-02-22 ENCOUNTER — Encounter: Payer: Self-pay | Admitting: Medical-Surgical

## 2024-02-22 ENCOUNTER — Other Ambulatory Visit: Payer: Self-pay | Admitting: Medical-Surgical

## 2024-02-22 ENCOUNTER — Other Ambulatory Visit: Payer: Self-pay

## 2024-02-22 NOTE — Telephone Encounter (Unsigned)
 Copied from CRM [id number]. Topic: Clinical - Medication Refill >> Feb 22, 2024  1:53 PM Precious C wrote: Medication: amphetamine -dextroamphetamine  (ADDERALL) 30 MG tablet  Has the patient contacted their pharmacy? No, pt usually call in to clinic first (Agent: If no, request that the patient contact the pharmacy for the refill. If patient does not wish to contact the pharmacy document the reason why and proceed with request.) (Agent: If yes, when and what did the pharmacy advise?)  This is the patient's preferred pharmacy:  [id number] [place], [place] - [address] [place] [address] Phone: [phone] Fax: [phone]  Is this the correct pharmacy for this prescription? Yes If no, delete pharmacy and type the correct one.   Has the prescription been filled recently? No  Is the patient out of the medication? Yes  Has the patient been seen for an appointment in the last year OR does the patient have an upcoming appointment? Yes  Can we respond through MyChart? Yes  Agent: Please be advised that Rx refills may take up to 3 business days. We ask that you follow-up with your pharmacy.

## 2024-02-22 NOTE — Telephone Encounter (Signed)
 Requesting rx rf of Adderall 30mg   Last written 01/28/2024 Last OV 11/29/2023 Upcoming appt 02/23/2024

## 2024-02-23 ENCOUNTER — Encounter: Payer: Self-pay | Admitting: Medical-Surgical

## 2024-02-23 ENCOUNTER — Ambulatory Visit: Admitting: Medical-Surgical

## 2024-02-23 ENCOUNTER — Ambulatory Visit (INDEPENDENT_AMBULATORY_CARE_PROVIDER_SITE_OTHER)

## 2024-02-23 VITALS — BP 108/70 | HR 78 | Resp 20 | Ht 74.0 in | Wt 213.0 lb

## 2024-02-23 DIAGNOSIS — G8929 Other chronic pain: Secondary | ICD-10-CM

## 2024-02-23 DIAGNOSIS — Z1211 Encounter for screening for malignant neoplasm of colon: Secondary | ICD-10-CM

## 2024-02-23 DIAGNOSIS — F902 Attention-deficit hyperactivity disorder, combined type: Secondary | ICD-10-CM

## 2024-02-23 DIAGNOSIS — M4807 Spinal stenosis, lumbosacral region: Secondary | ICD-10-CM | POA: Diagnosis not present

## 2024-02-23 DIAGNOSIS — M4856XA Collapsed vertebra, not elsewhere classified, lumbar region, initial encounter for fracture: Secondary | ICD-10-CM | POA: Diagnosis not present

## 2024-02-23 DIAGNOSIS — M47816 Spondylosis without myelopathy or radiculopathy, lumbar region: Secondary | ICD-10-CM | POA: Diagnosis not present

## 2024-02-23 DIAGNOSIS — M5440 Lumbago with sciatica, unspecified side: Secondary | ICD-10-CM | POA: Diagnosis not present

## 2024-02-23 DIAGNOSIS — M5441 Lumbago with sciatica, right side: Secondary | ICD-10-CM

## 2024-02-23 MED ORDER — AMPHETAMINE-DEXTROAMPHETAMINE 30 MG PO TABS
30.0000 mg | ORAL_TABLET | Freq: Every day | ORAL | 0 refills | Status: DC
Start: 2024-04-23 — End: 2024-02-28

## 2024-02-23 MED ORDER — AMPHETAMINE-DEXTROAMPHETAMINE 30 MG PO TABS
30.0000 mg | ORAL_TABLET | Freq: Every day | ORAL | 0 refills | Status: DC
Start: 2024-03-24 — End: 2024-02-28

## 2024-02-23 MED ORDER — AMPHETAMINE-DEXTROAMPHETAMINE 30 MG PO TABS
30.0000 mg | ORAL_TABLET | Freq: Every day | ORAL | 0 refills | Status: DC
Start: 2024-02-23 — End: 2024-02-28

## 2024-02-23 MED ORDER — PREDNISONE 50 MG PO TABS: 50.0000 mg | ORAL_TABLET | Freq: Every day | ORAL | 0 refills | Status: DC

## 2024-02-23 NOTE — Progress Notes (Signed)
        Established patient visit   History of Present Illness   Discussed the use of AI scribe software for clinical note transcription with the patient, who gave verbal consent to proceed.  History of Present Illness   Brett Brown is a 43 year old male who presents with right low back pain and ADHD follow-up.  Right low back pain with radicular symptoms - Right low back pain, sometimes radiating to the thigh near the sacroiliac (SI) joint and occasionally midline - Pain can be extreme, especially when unable to relieve it - No red flag symptoms - No current weakness, numbness, or tingling in the legs except in certain positions - No urinary or stool incontinence - Flexeril  has not been taken - Tylenol , ibuprofen , heat, ice, and stretching have been ineffective - Seeking a chiropractor due to favorable response in the past  Attention deficit hyperactivity disorder (adhd) symptoms and treatment - Adderall 30 mg once daily is effective for symptom control - No side effects from Adderall - No unexpected weight changes - No sleep disturbances     Physical Exam   Physical Exam Vitals and nursing note reviewed.  Constitutional:      General: He is not in acute distress.    Appearance: Normal appearance. He is not ill-appearing.  HENT:     Head: Normocephalic and atraumatic.  Cardiovascular:     Rate and Rhythm: Normal rate and regular rhythm.     Pulses: Normal pulses.     Heart sounds: Normal heart sounds. No murmur heard.    No friction rub. No gallop.  Pulmonary:     Effort: Pulmonary effort is normal. No respiratory distress.     Breath sounds: Normal breath sounds.  Skin:    General: Skin is warm and dry.  Neurological:     Mental Status: He is alert and oriented to person, place, and time.     Gait: Gait abnormal (gait stiff secondary to back pain).  Psychiatric:        Mood and Affect: Mood normal.        Behavior: Behavior normal.        Thought Content:  Thought content normal.        Judgment: Judgment normal.    Assessment & Plan     Chronic right low back pain with lumbar radiculopathy Chronic low back pain with lumbar radiculopathy likely due to lumbar spine degeneration and prior injuries. Symptoms include numbness in the thigh and occasional midline pain. Discussed chiropractic care and physical therapy as non-invasive options. - Order updated lumbar spine x-rays. - Prescribe prednisone  50 mg once daily for 5 days, morning dose. - Advise holding or adjusting Adderall dosage while on prednisone  if necessary. - Recommend high-dose ibuprofen  or Aleve post-prednisone  for inflammation. - Provide home exercises and stretches. - Refer to physical therapy with potential dry needling. - Discuss potential chiropractor referral if desired. - Consider MRI if symptoms persist after 6 weeks of conservative treatment.  Attention-deficit hyperactivity disorder, combined type ADHD well-managed with current medication. No side effects or issues with Adderall 30 mg once daily. - Continue Adderall 30 mg once daily.  General Health Maintenance Referral to gastroenterology for colonoscopy screening due to age. - Refer to GI for colonoscopy screening.     Follow up   Return in about 6 months (around 08/23/2024) for ADHD follow up. __________________________________ Zada FREDRIK Palin, DNP, APRN, FNP-BC Primary Care and Sports Medicine [facility] [place]

## 2024-02-24 ENCOUNTER — Ambulatory Visit (HOSPITAL_COMMUNITY)
Admission: EM | Admit: 2024-02-24 | Discharge: 2024-02-25 | Disposition: A | Discharge status: Other Acute Inpatient Hospital | Attending: Nurse Practitioner | Admitting: Nurse Practitioner

## 2024-02-24 DIAGNOSIS — F600 Paranoid personality disorder: Secondary | ICD-10-CM | POA: Insufficient documentation

## 2024-02-24 DIAGNOSIS — R4586 Emotional lability: Secondary | ICD-10-CM | POA: Insufficient documentation

## 2024-02-24 DIAGNOSIS — F22 Delusional disorders: Secondary | ICD-10-CM | POA: Diagnosis not present

## 2024-02-24 DIAGNOSIS — M549 Dorsalgia, unspecified: Secondary | ICD-10-CM | POA: Insufficient documentation

## 2024-02-24 DIAGNOSIS — F411 Generalized anxiety disorder: Secondary | ICD-10-CM | POA: Insufficient documentation

## 2024-02-24 DIAGNOSIS — Z7952 Long term (current) use of systemic steroids: Secondary | ICD-10-CM | POA: Insufficient documentation

## 2024-02-24 DIAGNOSIS — G8929 Other chronic pain: Secondary | ICD-10-CM | POA: Insufficient documentation

## 2024-02-25 ENCOUNTER — Other Ambulatory Visit: Payer: Self-pay

## 2024-02-25 ENCOUNTER — Encounter (HOSPITAL_COMMUNITY): Payer: Self-pay | Admitting: Psychiatry

## 2024-02-25 ENCOUNTER — Inpatient Hospital Stay (HOSPITAL_COMMUNITY)
Admission: AD | Admit: 2024-02-25 | Discharge: 2024-02-29 | DRG: 885 | Disposition: A | Discharge status: Home / Self Care | Source: Intra-hospital

## 2024-02-25 DIAGNOSIS — R4586 Emotional lability: Secondary | ICD-10-CM | POA: Diagnosis not present

## 2024-02-25 DIAGNOSIS — M109 Gout, unspecified: Secondary | ICD-10-CM | POA: Diagnosis present

## 2024-02-25 DIAGNOSIS — Z79899 Other long term (current) drug therapy: Secondary | ICD-10-CM | POA: Diagnosis not present

## 2024-02-25 DIAGNOSIS — Z720 Tobacco use: Secondary | ICD-10-CM

## 2024-02-25 DIAGNOSIS — F1290 Cannabis use, unspecified, uncomplicated: Secondary | ICD-10-CM | POA: Diagnosis not present

## 2024-02-25 DIAGNOSIS — F600 Paranoid personality disorder: Secondary | ICD-10-CM | POA: Diagnosis not present

## 2024-02-25 DIAGNOSIS — F4310 Post-traumatic stress disorder, unspecified: Secondary | ICD-10-CM | POA: Diagnosis present

## 2024-02-25 DIAGNOSIS — F23 Brief psychotic disorder: Principal | ICD-10-CM | POA: Diagnosis present

## 2024-02-25 DIAGNOSIS — F411 Generalized anxiety disorder: Secondary | ICD-10-CM | POA: Diagnosis not present

## 2024-02-25 DIAGNOSIS — F909 Attention-deficit hyperactivity disorder, unspecified type: Secondary | ICD-10-CM | POA: Diagnosis present

## 2024-02-25 DIAGNOSIS — M5416 Radiculopathy, lumbar region: Secondary | ICD-10-CM | POA: Diagnosis not present

## 2024-02-25 DIAGNOSIS — F22 Delusional disorders: Secondary | ICD-10-CM

## 2024-02-25 DIAGNOSIS — F29 Unspecified psychosis not due to a substance or known physiological condition: Secondary | ICD-10-CM | POA: Diagnosis not present

## 2024-02-25 DIAGNOSIS — M549 Dorsalgia, unspecified: Secondary | ICD-10-CM | POA: Diagnosis not present

## 2024-02-25 DIAGNOSIS — G8929 Other chronic pain: Secondary | ICD-10-CM | POA: Diagnosis present

## 2024-02-25 DIAGNOSIS — I10 Essential (primary) hypertension: Secondary | ICD-10-CM | POA: Diagnosis not present

## 2024-02-25 DIAGNOSIS — Z7952 Long term (current) use of systemic steroids: Secondary | ICD-10-CM | POA: Diagnosis not present

## 2024-02-25 LAB — LIPID PANEL
Cholesterol: 159 mg/dL (ref 0–200)
HDL: 53 mg/dL (ref >40)
LDL Cholesterol: 85 mg/dL (ref 0–99)
Total CHOL/HDL Ratio: 3 ratio
Triglycerides: 106 mg/dL (ref <150)
VLDL: 21 mg/dL (ref 0–40)

## 2024-02-25 LAB — CBC WITH DIFFERENTIAL/PLATELET
Abs Immature Granulocytes: 0.07 K/uL (ref 0.00–0.07)
Basophils Absolute: 0.1 K/uL (ref 0.0–0.1)
Basophils Relative: 1 %
Eosinophils Absolute: 0.1 K/uL (ref 0.0–0.5)
Eosinophils Relative: 1 %
HCT: 46.7 % (ref 39.0–52.0)
Hemoglobin: 15.6 g/dL (ref 13.0–17.0)
Immature Granulocytes: 1 %
Lymphocytes Relative: 35 %
Lymphs Abs: 4 K/uL (ref 0.7–4.0)
MCH: 29.9 pg (ref 26.0–34.0)
MCHC: 33.4 g/dL (ref 30.0–36.0)
MCV: 89.5 fL (ref 80.0–100.0)
Monocytes Absolute: 0.7 K/uL (ref 0.1–1.0)
Monocytes Relative: 7 %
Neutro Abs: 6.4 K/uL (ref 1.7–7.7)
Neutrophils Relative %: 55 %
Platelets: 355 K/uL (ref 150–400)
RBC: 5.22 MIL/uL (ref 4.22–5.81)
RDW: 12.3 % (ref 11.5–15.5)
WBC: 11.5 K/uL — ABNORMAL HIGH (ref 4.0–10.5)
nRBC: 0 % (ref 0.0–0.2)

## 2024-02-25 LAB — COMPREHENSIVE METABOLIC PANEL WITH GFR
ALT: 21 U/L (ref 0–44)
AST: 13 U/L — ABNORMAL LOW (ref 15–41)
Albumin: 4.2 g/dL (ref 3.5–5.0)
Alkaline Phosphatase: 73 U/L (ref 38–126)
Anion gap: 12 (ref 5–15)
BUN: 14 mg/dL (ref 6–20)
CO2: 27 mmol/L (ref 22–32)
Calcium: 9.7 mg/dL (ref 8.9–10.3)
Chloride: 100 mmol/L (ref 98–111)
Creatinine, Ser: 1.07 mg/dL (ref 0.61–1.24)
GFR, Estimated: >60 mL/min (ref >60)
Glucose, Bld: 98 mg/dL (ref 70–99)
Potassium: 4.1 mmol/L (ref 3.5–5.1)
Sodium: 139 mmol/L (ref 135–145)
Total Bilirubin: 0.5 mg/dL (ref 0.0–1.2)
Total Protein: 6.8 g/dL (ref 6.5–8.1)

## 2024-02-25 LAB — POCT URINE DRUG SCREEN - MANUAL ENTRY (I-SCREEN)
POC Amphetamine UR: NOT DETECTED
POC Buprenorphine (BUP): NOT DETECTED
POC Cocaine UR: NOT DETECTED
POC Marijuana UR: POSITIVE — AB
POC Methadone UR: NOT DETECTED
POC Methamphetamine UR: NOT DETECTED
POC Morphine: NOT DETECTED
POC Oxazepam (BZO): NOT DETECTED
POC Oxycodone UR: NOT DETECTED
POC Secobarbital (BAR): NOT DETECTED

## 2024-02-25 LAB — ETHANOL: Alcohol, Ethyl (B): <15 mg/dL (ref <15)

## 2024-02-25 MED ORDER — HALOPERIDOL LACTATE 5 MG/ML IJ SOLN: 5.0000 mg | Freq: Three times a day (TID) | INTRAMUSCULAR | Status: DC | PRN

## 2024-02-25 MED ORDER — ALUM & MAG HYDROXIDE-SIMETH 200-200-20 MG/5ML PO SUSP: 30.0000 mL | ORAL | Status: DC | PRN

## 2024-02-25 MED ORDER — LORAZEPAM 2 MG/ML IJ SOLN: 2.0000 mg | Freq: Three times a day (TID) | INTRAMUSCULAR | Status: DC | PRN

## 2024-02-25 MED ORDER — TRAZODONE HCL 50 MG PO TABS
50.0000 mg | ORAL_TABLET | Freq: Every evening | ORAL | Status: DC | PRN
  Administered 2024-02-25 – 2024-02-28 (×4): 50 mg via ORAL
  Filled 2024-02-25 (×4): qty 1

## 2024-02-25 MED ORDER — ACETAMINOPHEN 325 MG PO TABS: 650.0000 mg | ORAL_TABLET | Freq: Four times a day (QID) | ORAL | Status: DC | PRN

## 2024-02-25 MED ORDER — BUSPIRONE HCL 10 MG PO TABS
10.0000 mg | ORAL_TABLET | Freq: Two times a day (BID) | ORAL | Status: DC
  Administered 2024-02-25 – 2024-02-29 (×7): 10 mg via ORAL
  Filled 2024-02-25 (×7): qty 1

## 2024-02-25 MED ORDER — HALOPERIDOL LACTATE 5 MG/ML IJ SOLN: 10.0000 mg | Freq: Three times a day (TID) | INTRAMUSCULAR | Status: DC | PRN

## 2024-02-25 MED ORDER — HYDROXYZINE HCL 25 MG PO TABS: 25.0000 mg | ORAL_TABLET | Freq: Three times a day (TID) | ORAL | Status: DC | PRN

## 2024-02-25 MED ORDER — MAGNESIUM HYDROXIDE 400 MG/5ML PO SUSP: 30.0000 mL | Freq: Every day | ORAL | Status: DC | PRN

## 2024-02-25 MED ORDER — HALOPERIDOL 5 MG PO TABS: 5.0000 mg | ORAL_TABLET | Freq: Three times a day (TID) | ORAL | Status: DC | PRN

## 2024-02-25 MED ORDER — DIPHENHYDRAMINE HCL 50 MG PO CAPS: 50.0000 mg | ORAL_CAPSULE | Freq: Three times a day (TID) | ORAL | Status: DC | PRN

## 2024-02-25 MED ORDER — ARIPIPRAZOLE 5 MG PO TABS
5.0000 mg | ORAL_TABLET | Freq: Every day | ORAL | Status: DC
  Administered 2024-02-26: 5 mg via ORAL
  Filled 2024-02-25: qty 1

## 2024-02-25 MED ORDER — DIPHENHYDRAMINE HCL 25 MG PO CAPS: 50.0000 mg | ORAL_CAPSULE | Freq: Three times a day (TID) | ORAL | Status: DC | PRN

## 2024-02-25 MED ORDER — TRAZODONE HCL 50 MG PO TABS: 50.0000 mg | ORAL_TABLET | Freq: Every evening | ORAL | Status: DC | PRN

## 2024-02-25 MED ORDER — DIPHENHYDRAMINE HCL 50 MG/ML IJ SOLN: 50.0000 mg | Freq: Three times a day (TID) | INTRAMUSCULAR | Status: DC | PRN

## 2024-02-25 MED ORDER — NICOTINE POLACRILEX 2 MG MT GUM
2.0000 mg | CHEWING_GUM | OROMUCOSAL | Status: DC | PRN
  Administered 2024-02-25: 2 mg via ORAL

## 2024-02-25 MED ORDER — ARIPIPRAZOLE 5 MG PO TABS
5.0000 mg | ORAL_TABLET | Freq: Every day | ORAL | Status: DC
  Administered 2024-02-25: 5 mg via ORAL
  Filled 2024-02-25: qty 1

## 2024-02-25 MED ORDER — BUSPIRONE HCL 10 MG PO TABS
10.0000 mg | ORAL_TABLET | Freq: Two times a day (BID) | ORAL | Status: DC
  Administered 2024-02-25: 10 mg via ORAL
  Filled 2024-02-25: qty 1

## 2024-02-25 NOTE — ED Notes (Signed)
 IVC Stable. Transferring to [facility]  via  [employer]. all belongings and valuable sent with with patient and. [employer]  Denied current SI plan and intent.  Denied HI and A/V hallucinations

## 2024-02-25 NOTE — ED Notes (Signed)
 Pt A&O x 4, no distress noted, pt under IVC presents with anxiety and threatening to kill people over the phone.  Denies SI, HI or AVH, comfort measures given.  Monitoring for safety.

## 2024-02-25 NOTE — Progress Notes (Signed)
   02/24/24 2157  [facility] Triage Screening (Walk-ins at [facility] only)  How Did You Hear About Us ? Family/Friend  What Is the Reason for Your Visit/Call Today? Pt is a 43 yo male who present to [facility] involuntarily. Pt was brought to [facility] by sheriff. Pt reports that he is unsure exactly why he was brought to [facility]. Pt reports that he was sleep all day and got a knock on his door stating he has been threatening to kill people using his phone. Pt denies SI, HI, and AVH. Pt reports that his diagnosed with anxiety and is complaint with his medications. Pt is not active with outpatient services. Pt denies ETOH and drug use.  How Long Has This Been Causing You Problems? > than 6 months  Have You Recently Had Any Thoughts About Hurting Yourself? No  Are You Planning to Commit Suicide/Harm Yourself At This time? No  Have you Recently Had Thoughts About Hurting Someone Sherral? No  Are You Planning To Harm Someone At This Time? No  Explanation: Denies HI  Physical Abuse Denies  Verbal Abuse Denies  Sexual Abuse Denies  Exploitation of patient/patient's resources Denies  Self-Neglect Denies  Possible abuse reported to:  (n/a)  Are you currently experiencing any auditory, visual or other hallucinations? No  Have You Used Any Alcohol or Drugs in the Past 24 Hours? No  Do you have any current medical co-morbidities that require immediate attention? No  Clinician description of patient physical appearance/behavior: Pt was calm and cooperative  What Do You Feel Would Help You the Most Today? Treatment for Depression or other mood problem  If access to [facility] Urgent [facility] was not available, would you have sought care in the Emergency Department? No  Determination of Need Urgent (48 hours)  Options For Referral Outpatient Therapy;Medication Management  Determination of Need filed? Yes    Flowsheet Row ED from 02/24/2024 in [facility] ED to Hosp-Admission (Discharged) from 10/13/2022 in  [place] [place] [facility] UC from 04/13/2021 in [facility] at [facility] RISK CATEGORY No Risk No Risk No Risk

## 2024-02-25 NOTE — Progress Notes (Signed)
   02/25/24 1940  Psych Admission Type (Psych Patients Only)  Admission Status Voluntary  Psychosocial Assessment  Patient Complaints Worrying;Irritability  Eye Contact Brief  Facial Expression Anxious  Affect Anxious;Preoccupied  Speech Logical/coherent  Interaction Assertive  Motor Activity Other (Comment) (wnl)  Appearance/Hygiene In scrubs  Behavior Characteristics Anxious  Mood Anxious (pt upset with being IVC'd)  Thought Process  Coherency WDL  Content Blaming others  Delusions None reported or observed  Perception WDL  Hallucination None reported or observed  Judgment Poor  Confusion None  Danger to Self  Current suicidal ideation? Denies
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
## 2024-02-25 NOTE — Progress Notes (Signed)
 Patient is a 43 year old male who presented to [facility] from the [facility] under IVC- petitioned by his wife- due to extreme paranoid behavior. Pt has a hx of GAD and ADHD, and chronic lower back pain. Pt denies alcohol and illicit substance use. Pt currently denies SI/HI and A/VH and paranoia. Pt presented very irritable, refused to sign admission paperwork, complaining that he did not belong here. (Pt's mood did lighten a bit when stepping onto the unit) VS monitored and recorded. Skin check performed with MHT Belongings searched and secured in locker. . Patient was oriented to unit and schedule. Pt provided with sandwich tray and po fluids. Safety maintained with Q 15 min checks.

## 2024-02-25 NOTE — Progress Notes (Signed)
 [facility] LCSW Progress Note   02/25/2024    12:26 PM  TYMAR POLYAK   [id number]   Type of Contact and Topic:  Psychiatric Bed Placement   Pt accepted to [facility] 405-1    Patient meets inpatient criteria per Alan Mcardle, NP    The attending provider will be Dr. Raliegh  Call report to [phone]    Suzen Potts, RN @ [facility] notified.     Pt scheduled  to arrive at [facility] TODAY.    Bunnie Gallop, MSW, LCSW-A  12:27 PM 02/25/2024

## 2024-02-25 NOTE — Group Note (Signed)
 Date:  02/25/2024 Time:  3:37 PM  Group Topic/Focus: Responsibility- Discussing the importance of taking personal responsibility  Self Care:   The focus of this group is to help patients understand the importance of self-care in order to improve or restore emotional, physical, spiritual, interpersonal, and financial health.    Participation Level:  Did Not Attend  Additional Comments:  Patient was being admitted at time of group.  Taneah Masri D Skylynne Schlechter 02/25/2024, 3:37 PM

## 2024-02-25 NOTE — Discharge Instructions (Signed)
Pt transferred to [facility]. 
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
## 2024-02-25 NOTE — ED Notes (Signed)
 Pt presented lying in recliner.  Calm and cooperative.  Pt still states that he is unsure why he was brought here,  he did not call and threaten neighbors as he does not know their number.   Denied current SI plan and intent.  Denied HI and A.V hallucinations.  No overy s/s of psychosis.  Pt made no delusional statements Q 15 minute observation for safety continue

## 2024-02-25 NOTE — Plan of Care (Signed)
   Problem: Education: Goal: Knowledge of [place] [facility] Education information/materials will improve Outcome: Progressing

## 2024-02-25 NOTE — ED Notes (Signed)
 Pt observed lying in bed. Eyes closed respirations even and non labored. NAD q 15 minute observations continue for safety.

## 2024-02-25 NOTE — Tx Team (Signed)
 Initial Treatment Plan 02/25/2024 7:43 PM TATSUO MUSIAL FMW:[id number]    PATIENT STRESSORS: Marital or family conflict     PATIENT STRENGTHS: Average or above average intelligence  Capable of independent living  Communication skills  Physical Health  Supportive family/friends    PATIENT IDENTIFIED PROBLEMS:  (Paranoid behavior per IVC)      I don't belong here. I need to go home               DISCHARGE CRITERIA:  Improved stabilization in mood, thinking, and/or behavior Need for constant or close observation no longer present Reduction of life-threatening or endangering symptoms to within safe limits Verbal commitment to aftercare and medication compliance  PRELIMINARY DISCHARGE PLAN: Outpatient therapy Return to previous living arrangement Return to previous work or school arrangements  PATIENT/FAMILY INVOLVEMENT: This treatment plan has been presented to and reviewed with the patient, Brett Brown, .  The patient has been given the opportunity to ask questions and make suggestions.  Berwyn GORMAN Acosta, RN 02/25/2024, 7:43 PM

## 2024-02-25 NOTE — ED Notes (Signed)
 Pt frustrated and irrigated that his POC includes going to inpatient treatment.   Allowed patient to ventilate feelings . Pt spoke loudly at times, but ended up laying down in recliner and was quiet

## 2024-02-25 NOTE — BH Assessment (Signed)
 Comprehensive Clinical Assessment (CCA) Note   02/25/2024 Brett Brown [id number]  Disposition: Per Roxianne Green PIETY,  patient is recommended for inpatient admission.  Disposition SW to pursue appropriate inpatient options.  The patient demonstrates the following risk factors for suicide: Chronic risk factors for suicide include: psychiatric disorder of Anxiety Disorder. Acute risk factors for suicide include: family or marital conflict. Protective factors for this patient include: positive social support. Considering these factors, the overall suicide risk at this point appears to be low. Patient is not appropriate for outpatient follow up.   Patient is a 43 year old male with a history of anxiety disorder who presents involuntarily to [facility] for an assessment. Patient resides in the home with wife. Patient denies depression but reports anxiety.  Patient denies history of past suicide attempts. Patient denies hx of Substance abuse. Patient denies NSSIB, SI, HI, AVH.  Patient denies history of abuse or trauma. Patient denies current legal problems. Patient is not receiving outpatient therapy and psychiatry services.  Patient reports he  takes his medications as prescribed (see MAR) by his PCP and denies recent medication changes. Patient denies previous inpatient admission.  Patient denies access to weapons.   Patient is able to to contract for safety outside of the hospital.     During evaluation pt is in no acute distress. He is alert, oriented x 4, calm, cooperative and attentive. his mood is flat and guarded with congruent affect. He has normal speech, and behavior.  Objectively there is no evidence of psychosis/mania or delusional thinking.  Patient is able to converse coherently, goal directed thoughts, no distractibility, or pre-occupation.   He also denies suicidal/self-harm/homicidal ideation, psychosis, and paranoia.  Patient answered question appropriately.        Chief Complaint:  Chief Complaint  Patient presents with   IVC    Anxiety   Visit Diagnosis: Anxiety    CCA Screening, Triage and Referral (STR)  Patient Reported Information How did you hear about us ? Family/Friend  What Is the Reason for Your Visit/Call Today? Pt is a 43 yo male who present to [facility] involuntarily. Pt was brought to [facility] by sheriff. Pt reports that he is unsure exactly why he was brought to [facility]. Pt reports that he was sleep all day and got a knock on his door stating he has been threatening to kill people using his phone. Pt denies SI, HI, and AVH. Pt reports that his diagnosed with anxiety and is complaint with his medications. Pt is not active with outpatient services. Pt denies ETOH and drug use.  How Long Has This Been Causing You Problems? > than 6 months  What Do You Feel Would Help You the Most Today? Treatment for Depression or other mood problem   Have You Recently Had Any Thoughts About Hurting Yourself? No  Are You Planning to Commit Suicide/Harm Yourself At This time? No   Flowsheet Row ED from 02/24/2024 in [facility] ED to Hosp-Admission (Discharged) from 10/13/2022 in [place] LONG PERIOPERATIVE AREA UC from 04/13/2021 in [facility] at [facility] RISK CATEGORY No Risk No Risk No Risk    Have you Recently Had Thoughts About Hurting Someone [place]? No  Are You Planning to Harm Someone at This Time? No  Explanation: Denies HI   Have You Used Any Alcohol or Drugs in the Past 24 Hours? No  How Long Ago Did You Use Drugs or Alcohol? N/A What Did You Use  and How Much? N/A  Do You Currently Have a Therapist/Psychiatrist? No  Name of Therapist/Psychiatrist:    Have You Been Recently Discharged From Any Office Practice or Programs? No  Explanation of Discharge From Practice/Program: N/A    CCA Screening Triage Referral Assessment Type of Contact: Face-to-Face  Telemedicine  Service Delivery:   Is this Initial or Reassessment?   Date Telepsych consult ordered in [facility]:    Time Telepsych consult ordered in [facility]:    Location of Assessment: [facility] Assessment Services  Provider Location: [facility] Assessment Services   Collateral Involvement: none   Does Patient Have a Automotive engineer Guardian? No  Legal Guardian Contact Information: n/a  Copy of Legal Guardianship Form: -- (n/a)  Legal Guardian Notified of Arrival: -- (n/a)  Legal Guardian Notified of Pending Discharge: -- (n/a)  If Minor and Not Living with Parent(s), Who has Custody? n/a  Is CPS involved or ever been involved? Never  Is APS involved or ever been involved? Never   Patient Determined To Be At Risk for Harm To Self or Others Based on Review of Patient Reported Information or Presenting Complaint? No  Method: No Plan  Availability of Means: No access or NA  Intent: Vague intent or NA  Notification Required: No need or identified person  Additional Information for Danger to Others Potential: -- (n/a)  Additional Comments for Danger to Others Potential: n/a  Are There Guns or Other Weapons in Your Home? No  Types of Guns/Weapons: Denies access  Are These Weapons Safely Secured?                            No  Who Could Verify You Are Able To Have These Secured: Denies access  Do You Have any Outstanding Charges, Pending Court Dates, Parole/Probation? Pt denies pending legal charges  Contacted To Inform of Risk of Harm To Self or Others: -- (n/a)    Does Patient Present under Involuntary Commitment? Yes    [place] of Residence: [place]   Patient Currently Receiving the Following Services: Not Receiving Services   Determination of Need: Urgent (48 hours)   Options For Referral: Outpatient Therapy; Medication Management     CCA Biopsychosocial Patient Reported Schizophrenia/Schizoaffective Diagnosis in Past: No   Strengths: None reported   Mental  Health Symptoms Depression:  None   Duration of Depressive symptoms:    Mania:  None   Anxiety:   Worrying; Restlessness   Psychosis:  None   Duration of Psychotic symptoms:    Trauma:  None   Obsessions:  None   Compulsions:  None   Inattention:  None   Hyperactivity/Impulsivity:  None   Oppositional/Defiant Behaviors:  None   Emotional Irregularity:  None   Other Mood/Personality Symptoms:  Per IVC, pt displayed bizarre behavior.    Mental Status Exam Appearance and self-care  Stature:  Tall   Weight:  Average weight   Clothing:  Casual   Grooming:  Normal   Cosmetic use:  None   Posture/gait:  Normal   Motor activity:  Not Remarkable   Sensorium  Attention:  Normal   Concentration:  Normal   Orientation:  X5   Recall/memory:  Normal   Affect and Mood  Affect:  Labile   Mood:  Anxious; Irritable   Relating  Eye contact:  Normal   Facial expression:  Anxious   Attitude toward examiner:  Guarded; Cooperative   Thought and Language  Speech flow: Clear and Coherent   Thought content:  Appropriate to Mood and Circumstances   Preoccupation:  None   Hallucinations:  None   Organization:  Coherent   [employer] of Knowledge:  Good   Intelligence:  Average   Abstraction:  Normal   Judgement:  Impaired   Reality Testing:  Adequate   Insight:  Lacking   Decision Making:  Impulsive   Social Functioning  Social Maturity:  Impulsive   Social Judgement:  Impropriety   Stress  Stressors:  Family conflict; Work   Coping Ability:  Normal   Skill Deficits:  Activities of daily living   Supports:  Family     Religion: Religion/Spirituality Are You A Religious Person?: No How Might This Affect Treatment?: n/a  Leisure/Recreation: Leisure / Recreation Do You Have Hobbies?: No  Exercise/Diet: Exercise/Diet Do You Exercise?: No Have You Gained or Lost A Significant Amount of Weight in the Past Six Months?:  No Do You Follow a Special Diet?: No Do You Have Any Trouble Sleeping?: No   CCA Employment/Education Employment/Work Situation: Employment / Work Situation Employment Situation: Employed Work Stressors: Per [employer], pt has not been to work for the past 5 days without informing his job due to thinking 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?: No  Education: Education Is Patient Currently Attending School?: No Last Grade Completed: 12 Did You Product manager?: No Did You Have An Individualized Education Program (IIEP): No Did You Have Any Difficulty At [employer]?: No Patient's Education Has Been Impacted by Current Illness: No   CCA Family/Childhood History Family and Relationship History: Family history Marital status: Married Number of Years Married:  Industrial/product designer) What types of issues is patient dealing with in the relationship?: Per IVC, pt's wife is concern about pt's well being and does not feel safe Additional relationship information: n/a Does patient have children?:  (UTA)  Childhood History:  Childhood History By whom was/is the patient raised?: Mother Did patient suffer any verbal/emotional/physical/sexual abuse as a child?: No Did patient suffer from severe childhood neglect?: No Has patient ever been sexually abused/assaulted/raped as an adolescent or adult?: No Was the patient ever a victim of a crime or a disaster?: No Witnessed domestic violence?: No Has patient been affected by domestic violence as an adult?: No       CCA Substance Use Alcohol/Drug Use: Alcohol / Drug Use Pain Medications: SEE MAR Prescriptions: SEE MAR Over the Counter: SEE MAR History of alcohol / drug use?: No history of alcohol / drug abuse Longest period of sobriety (when/how long): Pt denies etoh/ drug use Negative Consequences of Use:  (n/a) Withdrawal Symptoms:  (n/a)                         ASAM's:   Six Dimensions of Multidimensional Assessment  Dimension 1:  Acute Intoxication and/or Withdrawal Potential:   Dimension 1:  Description of individual's past and current experiences of substance use and withdrawal: n/a  Dimension 2:  Biomedical Conditions and Complications:   Dimension 2:  Description of patient's biomedical conditions and  complications: n/a  Dimension 3:  Emotional, Behavioral, or Cognitive Conditions and Complications:  Dimension 3:  Description of emotional, behavioral, or cognitive conditions and complications: n/a  Dimension 4:  Readiness to Change:  Dimension 4:  Description of Readiness to Change criteria: n/a  Dimension 5:  Relapse, Continued  use, or Continued Problem Potential:  Dimension 5:  Relapse, continued use, or continued problem potential critiera description: n/a  Dimension 6:  Recovery/Living Environment:  Dimension 6:  Recovery/Iiving environment criteria description: n/a  ASAM Severity Score:    ASAM Recommended Level of Treatment: ASAM Recommended Level of Treatment:  (n/a)   Substance use Disorder (SUD) Substance Use Disorder (SUD)  Checklist Symptoms of Substance Use:  (n/a)  Recommendations for Services/Supports/Treatments: Recommendations for Services/Supports/Treatments Recommendations For Services/Supports/Treatments: Individual Therapy, Medication Management, Inpatient Hospitalization  Disposition Recommendation per psychiatric provider: We recommend inpatient psychiatric hospitalization after medical hospitalization. Patient has been involuntarily committed on 02/24/24.    DSM5 Diagnoses: Patient Active Problem List   Diagnosis Date Noted   Idiopathic chronic gout without tophus 12/08/2021   Testosterone  deficiency in male 01/05/2021   ED (erectile dysfunction) 06/18/2019   GAD (generalized anxiety disorder) 10/05/2018   Primary hypertension 04/19/2018   ADHD 04/19/2018     Referrals to Alternative Service(s): Referred to  Alternative Service(s):   Place:   Date:   Time:    Referred to Alternative Service(s):   Place:   Date:   Time:    Referred to Alternative Service(s):   Place:   Date:   Time:    Referred to Alternative Service(s):   Place:   Date:   Time:     Rosina PARAS, [place], [facility]

## 2024-02-26 DIAGNOSIS — F1290 Cannabis use, unspecified, uncomplicated: Secondary | ICD-10-CM | POA: Diagnosis not present

## 2024-02-26 MED ORDER — NICOTINE POLACRILEX 2 MG MT GUM
4.0000 mg | CHEWING_GUM | OROMUCOSAL | Status: DC
  Administered 2024-02-26 – 2024-02-29 (×10): 4 mg via ORAL
  Filled 2024-02-26 (×9): qty 2

## 2024-02-26 MED ORDER — ALLOPURINOL 100 MG PO TABS
300.0000 mg | ORAL_TABLET | Freq: Every day | ORAL | Status: DC
  Administered 2024-02-26 – 2024-02-29 (×3): 300 mg via ORAL
  Filled 2024-02-26 (×3): qty 3

## 2024-02-26 MED ORDER — RISPERIDONE 2 MG PO TABS
2.0000 mg | ORAL_TABLET | Freq: Every day | ORAL | Status: DC
Start: 2024-02-26 — End: 2024-02-29
  Administered 2024-02-26 – 2024-02-28 (×3): 2 mg via ORAL
  Filled 2024-02-26 (×3): qty 1

## 2024-02-26 MED ORDER — NICOTINE 21 MG/24HR TD PT24
21.0000 mg | MEDICATED_PATCH | Freq: Every day | TRANSDERMAL | Status: DC
  Administered 2024-02-26 – 2024-02-29 (×3): 21 mg via TRANSDERMAL
  Filled 2024-02-26 (×3): qty 1

## 2024-02-26 NOTE — BHH Group Notes (Signed)
 Adult Psychoeducational Group Note  Date:  02/26/2024 Time:  10:13 AM  Group Topic/Focus:  Goals Group:   The focus of this group is to help patients establish daily goals to achieve during treatment and discuss how the patient can incorporate goal setting into their daily lives to aide in recovery.  Participation Level:  Did Not Attend  Additional Comments:  Pt was invited but did not attend orientation/goals group.  Niel CHRISTELLA Nightingale 02/26/2024, 10:13 AM

## 2024-02-26 NOTE — Group Note (Signed)
 Date:  02/26/2024 Time:  5:49 AM  Group Topic/Focus:  Wrap-Up Group:   The focus of this group is to help patients review their daily goal of treatment and discuss progress on daily workbooks.    Participation Level:  None  Participation Quality:  Patient did not participate in group  Affect:  Flat  Cognitive:  Lacking  Insight: None  Engagement in Group:  None  Modes of Intervention:  Discussion  Additional Comments:  Patient did not participate in group but was present   Bari Moats 02/26/2024, 5:49 AM
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
## 2024-02-26 NOTE — [facility] Suicide Risk Assessment (Addendum)
 [facility] Admission Suicide Risk Assessment   Nursing information obtained from:  Patient Demographic factors:  Male, Access to firearms, Caucasian Current Mental Status:  NA Loss Factors:  NA Historical Factors:  Impulsivity Risk Reduction Factors:  Living with another person, especially a relative  Total Time spent with patient: 45 minutes Principal Problem: Psychosis, unspecified psychosis type (HCC) Diagnosis:  Principal Problem:   Psychosis, unspecified psychosis type (HCC)   Subjective Data:   Brett Brown 43 y.o., male with past psychiatric history of anxiety and ADHD who presented to [facility] under IVC accompanied by GPD with complaints of psychotic behaviors including paranoia, auditory hallucinations, delusions, bizarre behaviors and labile mood.  Patient initially arrived to [facility] on 10/10, and admitted to [facility] under IVC on 10/11 for acute safety concerns. PMHx is significant for gout, HTN.   Continued Clinical Symptoms:  Alcohol Use Disorder Identification Test Final Score (AUDIT): 0 The Alcohol Use Disorders Identification Test, Guidelines for Use in Primary Care, Second Edition.  World Science writer [facility]). Score between 0-7:  no or low risk or alcohol related problems. Score between 8-15:  moderate risk of alcohol related problems. Score between 16-19:  high risk of alcohol related problems. Score 20 or above:  warrants further diagnostic evaluation for alcohol dependence and treatment.   CLINICAL FACTORS:   Severe Anxiety and/or Agitation Alcohol/Substance Abuse/Dependencies More than one psychiatric diagnosis Currently Psychotic   Physical Exam Vitals reviewed.  Constitutional:      General: He is not in acute distress.    Appearance: He is not ill-appearing.  Pulmonary:     Effort: Pulmonary effort is normal. No respiratory distress.  Neurological:     Mental Status: He is alert.     Review of Systems  All other systems reviewed and are  negative.  Blood pressure 134/89, pulse 93, temperature 98 F (36.7 C), temperature source Oral, resp. rate 16, height 6' 3 (1.905 m), weight 93.3 kg, SpO2 97%. Body mass index is 25.7 kg/m.   COGNITIVE FEATURES THAT CONTRIBUTE TO RISK:  Closed-mindedness, Loss of executive function, and Polarized thinking    SUICIDE RISK:   Mild:  Patient denies suicidal ideation.  There are no identifiable plans, no associated intent, mild dysphoria and related symptoms, good self-control (both objective and subjective assessment), few other risk factors, and identifiable protective factors, including available and accessible social support. That said, patient is paranoid and appears to harbor  delusional thought content. Has access to firearms, which increases risk.   HOMICIDE RISK: Severe: Patient denies homicidal ideation, however patient's wife is concerned about patient's excalation of delusional thinking, paranoid statements and recent threatening behavior. He has bought a new firearm and is carrying it around. He left two bullets in view of his wife, for her to find. She has recently filed divorce papers. Patient expresses objective paranoid thinking. Wife states that he accuses her of plotting against him. He has continued access to firearms (cannot find patient's revolver). She is currently weighing putting out a restraining order. He has no appreciable insight into the concerning nature of his behaviors and beliefs. Thus, patient's risk for homicide is rated as severe.   PLAN OF CARE: See H&P for assessment and plan.   I certify that inpatient services furnished can reasonably be expected to improve the patient's condition.   [name], MD 02/26/2024, 1:28 PM

## 2024-02-26 NOTE — Progress Notes (Signed)
(  Sleep Hours) - (Any PRNs that were needed, meds refused, or side effects to meds)- nicorette gum, trazodone (Any disturbances and when (visitation, over night)-none (Concerns raised by the patient)- unhappy he was ivc'd (SI/HI/AVH)- denies all

## 2024-02-26 NOTE — [facility] Group Notes (Signed)
 Adult Psychoeducational Group Note   Date:  02/26/2024 Time:  10:19 AM   Group Topic/Focus:  Activity:  Thumball activities for groups involve tossing a ball with questions or prompts, where the person who catches it responds to the panel under their thumb. This can be used for various purposes, such as icebreakers, team-building, and even physical warm-ups. Specific activities include collaborative exercises, guided discussions, and even movement-based games.    Participation Level:  Did Not Attend   Additional Comments:  Pt was invited but did not attend group for thumbball activity.    Brett Brown 02/26/2024, 10:19 AM

## 2024-02-26 NOTE — Group Note (Unsigned)
 Date:  02/26/2024 Time:  8:57 PM  Group Topic/Focus:  Wrap-Up Group:   The focus of this group is to help patients review their daily goal of treatment and discuss progress on daily workbooks.    Participation Level:  {BHH PARTICIPATION OZCZO:77735}  Participation Quality:  {B[facility] PARTICIPATION QUALITY:22265}  Affect:  {BHH AFFECT:22266}  Cognitive:  {BHH COGNITIVE:22267}  Insight: {BHH Insight2:20797}  Engagement in Group:  {BHH ENGAGEMENT IN HMNLE:77731}  Modes of Intervention:  {BHH MODES OF INTERVENTION:22269}  Additional Comments:  ***  Gwenn Nobie [place] 02/26/2024, 8:57 PM

## 2024-02-26 NOTE — Group Note (Unsigned)
 Date:  02/26/2024 Time:  8:31 PM  Group Topic/Focus:  Wrap-Up Group:   The focus of this group is to help patients review their daily goal of treatment and discuss progress on daily workbooks.     Participation Level:  {BHH PARTICIPATION OZCZO:77735}  Participation Quality:  {BHH PARTICIPATION QUALITY:22265}  Affect:  {BHH AFFECT:22266}  Cognitive:  {BHH COGNITIVE:22267}  Insight: {BHH Insight2:20797}  Engagement in Group:  {BHH ENGAGEMENT IN HMNLE:77731}  Modes of Intervention:  {BHH MODES OF INTERVENTION:22269}  Additional Comments:  ***  Gwenn Nobie [place] 02/26/2024, 8:31 PM

## 2024-02-26 NOTE — Progress Notes (Signed)
   02/26/24 1030  Psych Admission Type (Psych Patients Only)  Admission Status Involuntary  Psychosocial Assessment  Patient Complaints Irritability  Eye Contact Brief  Facial Expression Anxious  Affect Anxious;Preoccupied;Irritable  Speech Logical/coherent  Interaction Assertive  Motor Activity Other (Comment) (WDL)  Appearance/Hygiene Unremarkable  Behavior Characteristics Anxious  Mood Anxious  Thought Process  Coherency WDL  Content Blaming others  Delusions None reported or observed  Perception WDL  Hallucination None reported or observed  Judgment Poor  Confusion None  Danger to Self  Current suicidal ideation? Denies  Agreement Not to Harm Self Yes  Description of Agreement Verbal  Danger to Others  Danger to Others None reported or observed

## 2024-02-26 NOTE — Plan of Care (Signed)
   Problem: Activity: Goal: Interest or engagement in activities will improve Outcome: Progressing

## 2024-02-26 NOTE — Progress Notes (Signed)
   02/26/24 2129  Psych Admission Type (Psych Patients Only)  Admission Status Involuntary  Psychosocial Assessment  Patient Complaints Other (Comment) (pt c/o being here ivc'd)  Eye Contact Brief  Facial Expression Anxious  Affect Anxious;Preoccupied  Speech Logical/coherent  Interaction Assertive  Motor Activity Other (Comment) (wnl)  Appearance/Hygiene In scrubs  Behavior Characteristics Anxious  Mood Anxious;Pleasant  Thought Process  Coherency WDL  Content Blaming others  Delusions None reported or observed  Perception WDL  Hallucination None reported or observed  Judgment Poor  Confusion None  Danger to Self  Current suicidal ideation? Denies

## 2024-02-26 NOTE — H&P (Addendum)
 Psychiatric Admission Assessment Adult  Patient Identification:  Brett Brown MRN:  [id number] Date of Evaluation:  02/26/2024 Chief Complaint:  Psychosis, unspecified psychosis type (HCC) [F29] Principal Diagnosis:  Psychosis, unspecified psychosis type (HCC) Diagnosis:  Principal Problem:   Psychosis, unspecified psychosis type (HCC)    CC:   I don't know why I'm here  Brett Brown 43 y.o., male with past psychiatric history of anxiety and ADHD who presented to [facility] under IVC accompanied by GPD with complaints of psychotic behaviors including paranoia, auditory hallucinations, delusions, bizarre behaviors and labile mood.  Patient initially arrived to [facility] on 10/10, and admitted to [facility] under IVC on 10/11 for acute safety concerns. PMHx is significant for gout, HTN.   HPI:   On interview, patient says that he has no clue why he is here.  He is talkative, tangential, uninterruptible but largely amenable to interview.  Patient notes that 2 days ago at 7 AM he was laying on his couch when he found a sheriff's deputy on his front porch, who detained him after some reports from the neighbors.  He was confused by this as he does not know his neighbors.  Also believes work had sent a wellness check on him, but he is confused as he has been checking in over the phone using the automated system.  He professes astonishment/confusion as to the reasoning behind this.  Patient endorses significant delusional content throughout course of interview, notes that people have been stealing things from him without clear evidence that this has happened. (That is, having stolen things from his glove box without having broken into his car, etc.) Believes that somebody from work has been spying on him, and has been using a Wi-Fi blocker which he has circumvented by using a 2G network.  Patient has some more obvious delusions about being spied upon by unknown parties but possibly by the [facility] --  which patient recognized was happening because he was experiencing input lag when operating his iPhone.  Suspects that the same people who have been stealing my cameras and their SD cards are also behind the spying on his phone.  Patient is guarded when discussing his wife, however he does endorse that they are getting a divorce and that she has been acting weird.  Also says that she is not a reliable source of information and should be in here.  Patient ended the interview by saying the second I am out of here, I wanted you to her which she did to me (filing an involuntary commitment.)  Patient denies any history of primary psychotic disorders or auditory visual hallucinations.  Does not see a psychiatrist or therapist.  No previous psychiatric hospitalizations.  Endorses ongoing cannabis use in the form of THC vapes (2 or 3 hits a day).  Endorses a past psychiatric history of anxiety and ADHD, for which she takes BuSpar .  However, patient does not have access to Adderall at this time.  Denies low mood.  Endorses hypervigilance in crowds, and social anxiety around going outside.  Denies history of bipolar disorder and symptoms consistent with bipolar disorder.  Denies low mood.  Does not see a psychiatrist or therapist at this time.  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  Denies auditory and visual hallucinations.  Denies delusional thinking.  Endorses flashbacks, but does not meet criteria for PTSD at this time.    Collateral information via Lanette Chavous,  wife [phone]): Noted progressively worsening of his mood, uncontrollable crying, hostile behavior towards wife, extreme paranoia over the past two years.  Has always been paranoid about his property. Has never wanted anyone to come and do work at the house -- has gotten a number of letters for the [facility]. Husband hasn't taking care of the house. Wife  hired someone, and patient did not like the way the man was looking at me. Accusing wife of coordinating with the FBI to have him killed and spied on. Accusing her of stealing medicine.  Called the sheriffs out there because patient had taken the shotgun out. Was afraid to come out of the bathroom. Left two bullets sitting on a stand in the bathroom after being served divorce papers. 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 Has been hearing the voice of god recently telling him to do things. Also had this behaviors earlier in life with alcohol in 2020. Left doors and windows unlocked. Had a shotgun and revolver. Patient bought a new rifle with a scope very recently that she had never seen before after wife got back from the magistrate. Has hidden two of his guns, but doesn't know where the revolver is. Previous history of depression and alcoholism. Does see some benefit from lexapro  in the past. Does not know 100% for sure about current substance use. Unsure if he is sober from alcohol. Sometimes takes too much of his Adderall. Doen't know if he will be allowed to return to the house after discharge: . Is considering a restraining order.   Psychiatric ROS:  As above.  Past Psychiatric History: Current psychiatrist: None Current therapist: None Previous psychiatric diagnoses: Anxiety, PTSD Current psychiatric medications: Buspar  5 mg BID, adderall 30 mg qday (taking BID)  Psychiatric medication history/compliance: Inconsistent use of Lexapro  and Adderall, per wife Psychiatric hospitalization(s): None Psychotherapy history: In the distant past Neuromodulation history: None History of suicide (obtained from HPI): Wife reports previous history of suicidality, patient denies, none per chart review History of homicide or aggression (obtained in HPI): Patient has been making threatening gestures recently towards wife as documented above  Substance Abuse History: Alcohol: Previous severe  alcohol use history stemming from 2020, last known drink was in November of last year Tobacco: Regular dip use Cannabis: Regular cannabis use, patient says he takes 2 puffs of THC vape at night IV drug use: Denies Prescription drug use: denied. Other illicit drugs: Denies  Rehab history: denied.  Past Medical History: PCP: Sees [facility]. Medical diagnoses: HTN, ED, Gout, chronic lower back pain with sciatic involvement Medications: Given short course of prednisone , only use 1 pill 10/9, colchicine , allopurinol  Allergies: denied. Hospitalizations: denied. Surgeries: denied. Trauma: Repeated head trauma from MMA/football Seizures: denied.  Social History: Living situation: Lives with wife Education: Some college Occupational history: Currently working Marital status: Married, divorce papers served Children: None Legal: Denies Hotel manager: previous Chief Operating Officer to firearms: ENDORSES, WIFE CANNOT FIND REVOLVER, HAS HIDDEN OTHER WEAPONS  Family Psychiatric History: Psychiatric diagnoses: none. Suicide history: none.  Violence/aggression: none. Substance use history: denied.  Family Medical History:  None pertienent.   Total Time spent with patient: 45 minutes  Is the patient at risk to self? No.  Has the patient been a risk to self in the past 6 months? No.  Has the patient been a risk to self within the distant past? No.   Is the patient a risk to others? Yes.    Has the patient been  a risk to others in the past 6 months? Yes.    Has the patient been a risk to others within the distant past? No.   [place] Scale:  Flowsheet Row [facility] (Current) [facility] from [facility]/24/2024 in [facility] ED to Hosp-Admission (Discharged) from 10/13/2022 in [facility]  C-SSRS RISK CATEGORY No Risk No Risk No Risk     Tobacco Screening:  Social History   Tobacco Use  Smoking  Status Never  Smokeless Tobacco Never    BH Tobacco Counseling     Are you interested in Tobacco Cessation Medications?  No, patient refused Counseled patient on smoking cessation:  Refused/Declined practical counseling Reason Tobacco Screening Not Completed: Patient Refused Screening       Social History:  Social History   Substance and Sexual Activity  Alcohol Use No     Social History   Substance and Sexual Activity  Drug Use No    Additional Social History:      Allergies:  No Known Allergies Lab Results:  Results for orders placed or performed during the hospital encounter of 02/24/24 (from the past 48 hours)  CBC with Differential/Platelet     Status: Abnormal   Collection Time: 02/25/24  1:05 AM  Result Value Ref Range   WBC 11.5 (H) 4.0 - 10.5 K/uL   RBC 5.22 4.22 - 5.81 MIL/uL   Hemoglobin 15.6 13.0 - 17.0 g/dL   HCT 53.2 60.9 - 47.9 %   MCV 89.5 80.0 - 100.0 fL   MCH 29.9 26.0 - 34.0 pg   MCHC 33.4 30.0 - 36.0 g/dL   RDW 87.6 88.4 - 84.4 %   Platelets 355 150 - 400 K/uL   nRBC 0.0 0.0 - 0.2 %   Neutrophils Relative % 55 %   Neutro Abs 6.4 1.7 - 7.7 K/uL   Lymphocytes Relative 35 %   Lymphs Abs 4.0 0.7 - 4.0 K/uL   Monocytes Relative 7 %   Monocytes Absolute 0.7 0.1 - 1.0 K/uL   Eosinophils Relative 1 %   Eosinophils Absolute 0.1 0.0 - 0.5 K/uL   Basophils Relative 1 %   Basophils Absolute 0.1 0.0 - 0.1 K/uL   Immature Granulocytes 1 %   Abs Immature Granulocytes 0.07 0.00 - 0.07 K/uL    Comment: Performed at [facility], [address] [place], [place] [address]  Comprehensive metabolic panel     Status: Abnormal   Collection Time: 02/25/24  1:05 AM  Result Value Ref Range   Sodium 139 135 - 145 mmol/L   Potassium 4.1 3.5 - 5.1 mmol/L   Chloride 100 98 - 111 mmol/L   CO2 27 22 - 32 mmol/L   Glucose, Bld 98 70 - 99 mg/dL    Comment: Glucose reference range applies only to samples taken after fasting for at least 8 hours.   BUN 14 6 -  20 mg/dL   Creatinine, Ser 8.92 0.61 - 1.24 mg/dL   Calcium 9.7 8.9 - 89.6 mg/dL   Total Protein 6.8 6.5 - 8.1 g/dL   Albumin 4.2 3.5 - 5.0 g/dL   AST 13 (L) 15 - 41 U/L   ALT 21 0 - 44 U/L   Alkaline Phosphatase 73 38 - 126 U/L   Total Bilirubin 0.5 0.0 - 1.2 mg/dL   GFR, Estimated >39 >39 mL/min    Comment: (NOTE) Calculated using the CKD-EPI Creatinine Equation (2021)  Anion gap 12 5 - 15    Comment: Performed at [facility], [address], [place], [place] [address]  Lipid panel     Status: None   Collection Time: 02/25/24  1:05 AM  Result Value Ref Range   Cholesterol 159 0 - 200 mg/dL   Triglycerides 893 <849 mg/dL   HDL 53 >59 mg/dL   Total CHOL/HDL Ratio 3.0 RATIO   VLDL 21 0 - 40 mg/dL   LDL Cholesterol 85 0 - 99 mg/dL    Comment:        Total Cholesterol/HDL:CHD Risk Coronary Heart Disease Risk Table                     Men   Women  1/2 Average Risk   3.4   3.3  Average Risk       5.0   4.4  2 X Average Risk   9.6   7.1  3 X Average Risk  23.4   11.0        Use the calculated Patient Ratio above and the CHD Risk Table to determine the patient's CHD Risk.        ATP III CLASSIFICATION (LDL):  <100     mg/dL   Optimal  899-870  mg/dL   Near or Above                    Optimal  130-159  mg/dL   Borderline  839-810  mg/dL   High  >809     mg/dL   Very High Performed at [facility], [address] [place], [place] [address]   Ethanol     Status: None   Collection Time: 02/25/24  1:05 AM  Result Value Ref Range   Alcohol, Ethyl (B) <15 <15 mg/dL    Comment: (NOTE) For medical purposes only. Performed at [facility], [address] [place], [place] [address]   POCT Urine Drug Screen - (I-Screen)     Status: Abnormal   Collection Time: 02/25/24  1:37 PM  Result Value Ref Range   POC Amphetamine  UR None Detected NONE DETECTED (Cut Off Level 1000 ng/mL)   POC Secobarbital (BAR) None Detected NONE DETECTED (Cut Off Level 300 ng/mL)    POC Buprenorphine (BUP) None Detected NONE DETECTED (Cut Off Level 10 ng/mL)   POC Oxazepam (BZO) None Detected NONE DETECTED (Cut Off Level 300 ng/mL)   POC Cocaine UR None Detected NONE DETECTED (Cut Off Level 300 ng/mL)   POC Methamphetamine UR None Detected NONE DETECTED (Cut Off Level 1000 ng/mL)   POC Morphine  None Detected NONE DETECTED (Cut Off Level 300 ng/mL)   POC Methadone UR None Detected NONE DETECTED (Cut Off Level 300 ng/mL)   POC Oxycodone  UR None Detected NONE DETECTED (Cut Off Level 100 ng/mL)   POC Marijuana UR Positive (A) NONE DETECTED (Cut Off Level 50 ng/mL)    Blood alcohol level:  Lab Results  Component Value Date   ETH <15 02/25/2024    Metabolic disorder labs:  No results found for: HGBA1C, MPG Lab Results  Component Value Date   PROLACTIN 16.3 05/14/2021   Lab Results  Component Value Date   CHOL 159 02/25/2024   TRIG 106 02/25/2024   HDL 53 02/25/2024   CHOLHDL 3.0 02/25/2024   VLDL 21 02/25/2024   LDLCALC 85 02/25/2024   LDLCALC 75 03/16/2023    Current Medications: Current Facility-Administered Medications  Medication Dose Route Frequency Provider Last Rate Last Admin   acetaminophen  (TYLENOL ) tablet 650 mg  650 mg Oral Q6H PRN Brent, Amanda C, NP       allopurinol  (ZYLOPRIM ) tablet 300 mg  300 mg Oral Daily Rollene Katz, MD       alum & mag hydroxide-simeth (MAALOX/MYLANTA) 200-200-20 MG/5ML suspension 30 mL  30 mL Oral Q4H PRN Brent, Amanda C, NP       busPIRone  (BUSPAR ) tablet 10 mg  10 mg Oral BID Brent, Amanda C, NP   10 mg at 02/26/24 1030   haloperidol (HALDOL) tablet 5 mg  5 mg Oral TID PRN Brent, Amanda C, NP       And   diphenhydrAMINE (BENADRYL) capsule 50 mg  50 mg Oral TID PRN Brent, Amanda C, NP       haloperidol lactate (HALDOL) injection 5 mg  5 mg Intramuscular TID PRN Brent, Amanda C, NP       And   diphenhydrAMINE (BENADRYL) injection 50 mg  50 mg Intramuscular TID PRN Brent, Amanda C, NP       And    LORazepam (ATIVAN) injection 2 mg  2 mg Intramuscular TID PRN Brent, Amanda C, NP       haloperidol lactate (HALDOL) injection 10 mg  10 mg Intramuscular TID PRN Brent, Amanda C, NP       And   diphenhydrAMINE (BENADRYL) injection 50 mg  50 mg Intramuscular TID PRN Brent, Amanda C, NP       And   LORazepam (ATIVAN) injection 2 mg  2 mg Intramuscular TID PRN Brent, Amanda C, NP       hydrOXYzine (ATARAX) tablet 25 mg  25 mg Oral TID PRN Brent, Amanda C, NP       magnesium hydroxide (MILK OF MAGNESIA) suspension 30 mL  30 mL Oral Daily PRN Brent, Amanda C, NP       nicotine (NICODERM CQ - dosed in mg/24 hours) patch 21 mg  21 mg Transdermal Daily Rollene Katz, MD       nicotine polacrilex (NICORETTE) gum 4 mg  4 mg Oral Q4H while awake Rollene Katz, MD       risperiDONE (RISPERDAL) tablet 2 mg  2 mg Oral QHS Rollene Katz, MD       traZODone (DESYREL) tablet 50 mg  50 mg Oral QHS PRN Brent, Amanda C, NP   50 mg at 02/25/24 2153    PTA Medications: Medications Prior to Admission  Medication Sig Dispense Refill Last Dose/Taking   allopurinol  (ZYLOPRIM ) 300 MG tablet Take 1 tablet (300 mg total) by mouth daily. 90 tablet 3    amphetamine -dextroamphetamine  (ADDERALL) 30 MG tablet Take 1 tablet by mouth daily before breakfast. 30 tablet 0    [START ON 03/24/2024] amphetamine -dextroamphetamine  (ADDERALL) 30 MG tablet Take 1 tablet by mouth daily before breakfast. 30 tablet 0    [START ON 04/23/2024] amphetamine -dextroamphetamine  (ADDERALL) 30 MG tablet Take 1 tablet by mouth daily before breakfast. 30 tablet 0    busPIRone  (BUSPAR ) 5 MG tablet Take 1-3 tablets (5-15 mg total) by mouth 2 (two) times daily. 540 tablet 1    colchicine  0.6 MG tablet TAKE 1 TABLET (0.6 MG TOTAL) BY MOUTH DAILY AS NEEDED (GOUT OR PSUEDOGOUT PAIN). 90 tablet 3    predniSONE  (DELTASONE ) 50 MG tablet Take 1 tablet (50 mg total) by mouth daily with breakfast. 5 tablet 0    tadalafil  (CIALIS ) 20 MG tablet Take  0.5-1 tablets (10-20  mg total) by mouth every other day as needed for erectile dysfunction. 10 tablet 11     Psychiatric Specialty Exam:  Presentation   General Appearance: Casual; Disheveled  Eye Contact: Good  Speech: Other (comment) (uninterruptable, fast)  Speech Volume: Normal  Handedness: No data recorded  Mood and Affect   Mood:  Angry; Anxious  Affect:  Labile   Thinking   Thought Processes:  Irrevelant  Descriptions of Associations:  Tangential  Orientation:  Full (Time, Place and Person)  Thought Content:  Paranoid Ideation; Perseveration; Tangential; Delusions  History of Schizophrenia/Schizoaffective disorder:  No   Duration of Psychotic Symptoms: N/A  Hallucinations:  Other (comment) (Patient denies, wife endorses AVH of him hearing and following commands from God)  Ideas of Reference:  None  Suicidal Thoughts:  No  Homicidal Thoughts:  No   Sensorium    Memory:  Immediate Fair  Judgment:  Impaired  Insight:  Shallow   Executive Functions    Concentration:  Fair  Attention Span:  Fair  Recall:  [employer] of Knowledge:  Fair  Language:  Fair   Psychomotor Activity:  Normal    Assets:  Housing; Manufacturing systems engineer; Physical Health    Sleep:  Good    Physical Exam Vitals reviewed.  Constitutional:      General: He is not in acute distress.    Appearance: He is not ill-appearing.  Pulmonary:     Effort: Pulmonary effort is normal. No respiratory distress.  Neurological:     Mental Status: He is alert.    Review of Systems  All other systems reviewed and are negative.  Blood pressure 134/89, pulse 93, temperature 98 F (36.7 C), temperature source Oral, resp. rate 16, height 6' 3 (1.905 m), weight 93.3 kg, SpO2 97%. Body mass index is 25.7 kg/m.   Treatment Plan Summary: Daily contact with patient to assess and evaluate symptoms and progress in treatment and Medication  management   ASSESSMENT:   Guadalupe Nickless is a 43 year old male with a past psychiatric history of anxiety on buspar , ADHD on home adderall 30 mg qday, suicidal threats and alcohol use disorder who presents to [facility] via [facility] under IVC petition for auditory hallucinations (respondent is hearing voices talking to him) paranoid and delusional thinking (friend with the FBI that has proven that his wife...hacked his phone...someone broke into the home had stole all the SD cards [containing video proof of wife's infidelity]...believes wife is trying to have him killed) in the setting of firearm possesion (respondent has started moving guns around home.) Respondent employer initiated a welfare check after 5 days of not reporting into work. Of note, recently refilled adderall 30 mg x30 days on 9/16, patient says he has been taking BID (prescribed qD). Additionally he was prescribed prednisone  50 mg x1 a day for five days on 10/09 for chronic low back pain with lumbar radiculopathy. +UDS for THC.   Per wife, patient appears to have been taking preparatory actions to threaten or enact violence against his wife including the purchase of a new rifle with scope, leaving 2 bullets in the bathroom, accusing her of cooperating with the FBI in an attempt to spy on him.  While patient denied homicidal ideation, it is clear on my interview that he has very clear paranoid and delusional beliefs regarding his being surveilled both online and in person, things having been stolen from him, people stalking him, etc.  It is possible that patient's regular Adderall use (and  sometimes misuse, using more per day than prescribed) as well as one-time dose of prednisone  worsened patient's paranoid thinking/behaviors, especially in the setting of a significant life stressor (new divorce). Also endorses regular THC use, which can worsen paranoia/psychosis.   Whatever the specific etiology, he preponderance of evidence suggests  that patient is currently a threat to his wife at this time given objective findings of delusional/paranoid thinking elicited on interview, which is consistent with the account given by his wife. Patient does not have a clear understanding of why his behaviors were of concern enough to warrant an involuntary commitment, and cannot furnish any reasonable alternative explanation to the situation at hand. Believe that patient can reasonably be expected to improve on antipsychotic medication over the next few days.  It is my opinion and the opinion of my attending Dr. Raliegh that the involuntary commitment be continued at this time.  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 - Daily contact with patient to assess and evaluate symptoms and progress in treatment - Patient's case to be discussed in multi-disciplinary team meeting -  Observation Level : q15 minute checks -  Vital signs:  q12 hours -  Precautions: suicide, elopement, and assault  2. Psychiatric Diagnoses and Treatment:    # Psychosis, unspecified  # Cannabis use - Stop abilify 5 mg qday for acute psychosis. - Start risperdal 2 mg at bedtime.  - Hold prednisone  and adderall with concern for active psychosis - The risks/benefits/side-effects/alternatives to this medication were discussed in detail with the patient and time was given for questions. The patient consents to medication trial.  - Metabolic profile and EKG monitoring obtained while on an atypical antipsychotic  BMI: 25.7 TSH: Ordered, awaiting.  Lipid panel: Ordered, awaiting.  HbgA1c: Ordered, awaiting.  QTc: 378 - Encouraged patient to participate in unit milieu and in scheduled group therapies  - Short Term Goals: Ability to identify changes in lifestyle to reduce  recurrence of condition will improve and Ability to verbalize feelings will improve - Long Term Goals: Improvement in symptoms so as ready for discharge  Other PRNS: Agitation, minor pain, GI concerns, sleep  Other labs reviewed on admission: WBC 11.5, CMP UNR, LDL 85, EtOH <15, THC+   3. Medical Issues Being Addressed:   # Tobacco Use Disorder  - Nicotine gum ordered Q4H PRN - Nicotine patch ordered - Smoking cessation encouraged.  # Gout - Started home allopurinol    # Chronic low back pain  - Standard pain PRNs, can adjust as needed  4. Discharge Planning:   - Estimated discharge date: 5-9 days - Social work and case management to assist with discharge planning and identification of hospital follow-up needs prior to discharge. - Discharge concerns: Need to establish a safety plan; medication compliance and effectiveness. - Discharge goals: Return home with outpatient referrals for mental health follow-up including medication management/psychotherapy.  I certify that inpatient services furnished can reasonably be expected to improve the patient's condition.    NB: This note was created using a voice recognition software as a result there may be grammatical errors inadvertently enclosed that do not reflect the nature of this encounter. Every attempt is made to correct such errors.   Odis Cleveland, MD PGY-2, Psychiatry Residency  10/12/20251:28 PM

## 2024-02-27 ENCOUNTER — Encounter (HOSPITAL_COMMUNITY): Payer: Self-pay

## 2024-02-27 DIAGNOSIS — F29 Unspecified psychosis not due to a substance or known physiological condition: Secondary | ICD-10-CM | POA: Diagnosis not present

## 2024-02-27 LAB — LIPID PANEL
Cholesterol: 165 mg/dL (ref 0–200)
HDL: 49 mg/dL (ref >40)
LDL Cholesterol: 101 mg/dL — ABNORMAL HIGH (ref 0–99)
Total CHOL/HDL Ratio: 3.3 ratio
Triglycerides: 71 mg/dL (ref <150)
VLDL: 14 mg/dL (ref 0–40)

## 2024-02-27 LAB — HEMOGLOBIN A1C
Hgb A1c MFr Bld: 5.1 % (ref 4.8–5.6)
Mean Plasma Glucose: 99.67 mg/dL

## 2024-02-27 LAB — TSH: TSH: 1.14 u[IU]/mL (ref 0.350–4.500)

## 2024-02-27 MED ORDER — ACETAMINOPHEN 500 MG PO TABS: 1000.0000 mg | ORAL_TABLET | Freq: Four times a day (QID) | ORAL | Status: DC | PRN

## 2024-02-27 MED ORDER — IBUPROFEN 600 MG PO TABS: 600.0000 mg | ORAL_TABLET | Freq: Four times a day (QID) | ORAL | Status: DC | PRN

## 2024-02-27 NOTE — [facility] Group Notes (Signed)
 The focus of this group is to help patients review their daily goal of treatment and discuss progress on daily workbooks. Pt was attentive and appropriate during tonight's wrap up group discussion. Pt was able to share overall day was good. Would like to work on discharge plan.

## 2024-02-27 NOTE — Plan of Care (Signed)
   Problem: Education: Goal: Knowledge of [place] [facility] Education information/materials will improve Outcome: Progressing Goal: Emotional status will improve Outcome: Progressing Goal: Mental status will improve Outcome: Progressing

## 2024-02-27 NOTE — Progress Notes (Signed)
   02/27/24 1600  Psych Admission Type (Psych Patients Only)  Admission Status Involuntary  Psychosocial Assessment  Patient Complaints Irritability  Eye Contact Brief  Facial Expression Anxious  Affect Anxious;Preoccupied  Speech Logical/coherent  Interaction Assertive  Motor Activity Slow  Appearance/Hygiene Unremarkable  Behavior Characteristics Anxious  Mood Anxious;Pleasant  Thought Process  Coherency WDL  Content Blaming others  Delusions None reported or observed  Perception WDL  Hallucination None reported or observed  Judgment Poor  Confusion None  Danger to Self  Current suicidal ideation? Denies  Description of Suicide Plan No Plan  Agreement Not to Harm Self Yes  Description of Agreement Verbal  Danger to Others  Danger to Others None reported or observed

## 2024-02-27 NOTE — BH IP Treatment Plan (Signed)
 Interdisciplinary Treatment and Diagnostic Plan Update  02/27/2024 Time of Session: 10:15AM Brett Brown MRN: [id number]  Principal Diagnosis: Psychosis, unspecified psychosis type [facility])  Secondary Diagnoses: Principal Problem:   Psychosis, unspecified psychosis type (HCC)   Current Medications:  Current Facility-Administered Medications  Medication Dose Route Frequency Provider Last Rate Last Admin   acetaminophen  (TYLENOL ) tablet 650 mg  650 mg Oral Q6H PRN Brent, Amanda C, NP       allopurinol  (ZYLOPRIM ) tablet 300 mg  300 mg Oral Daily Rollene Katz, MD   300 mg at 02/26/24 1519   alum & mag hydroxide-simeth (MAALOX/MYLANTA) 200-200-20 MG/5ML suspension 30 mL  30 mL Oral Q4H PRN Brent, Amanda C, NP       busPIRone  (BUSPAR ) tablet 10 mg  10 mg Oral BID Brent, Amanda C, NP   10 mg at 02/26/24 1806   haloperidol (HALDOL) tablet 5 mg  5 mg Oral TID PRN Brent, Amanda C, NP       And   diphenhydrAMINE (BENADRYL) capsule 50 mg  50 mg Oral TID PRN Brent, Amanda C, NP       haloperidol lactate (HALDOL) injection 5 mg  5 mg Intramuscular TID PRN Brent, Amanda C, NP       And   diphenhydrAMINE (BENADRYL) injection 50 mg  50 mg Intramuscular TID PRN Brent, Amanda C, NP       And   LORazepam (ATIVAN) injection 2 mg  2 mg Intramuscular TID PRN Brent, Amanda C, NP       haloperidol lactate (HALDOL) injection 10 mg  10 mg Intramuscular TID PRN Brent, Amanda C, NP       And   diphenhydrAMINE (BENADRYL) injection 50 mg  50 mg Intramuscular TID PRN Brent, Amanda C, NP       And   LORazepam (ATIVAN) injection 2 mg  2 mg Intramuscular TID PRN Brent, Amanda C, NP       hydrOXYzine (ATARAX) tablet 25 mg  25 mg Oral TID PRN Brent, Amanda C, NP       magnesium hydroxide (MILK OF MAGNESIA) suspension 30 mL  30 mL Oral Daily PRN Brent, Amanda C, NP       nicotine (NICODERM CQ - dosed in mg/24 hours) patch 21 mg  21 mg Transdermal Daily Rollene Katz, MD   21 mg at 02/26/24 1519    nicotine polacrilex (NICORETTE) gum 4 mg  4 mg Oral Q4H while awake Rollene Katz, MD   4 mg at 02/26/24 2107   risperiDONE (RISPERDAL) tablet 2 mg  2 mg Oral QHS Rollene Katz, MD   2 mg at 02/26/24 2107   traZODone (DESYREL) tablet 50 mg  50 mg Oral QHS PRN Brent, Amanda C, NP   50 mg at 02/26/24 2107   PTA Medications: Medications Prior to Admission  Medication Sig Dispense Refill Last Dose/Taking   allopurinol  (ZYLOPRIM ) 300 MG tablet Take 1 tablet (300 mg total) by mouth daily. 90 tablet 3    amphetamine -dextroamphetamine  (ADDERALL) 30 MG tablet Take 1 tablet by mouth daily before breakfast. 30 tablet 0    [START ON 03/24/2024] amphetamine -dextroamphetamine  (ADDERALL) 30 MG tablet Take 1 tablet by mouth daily before breakfast. 30 tablet 0    [START ON 04/23/2024] amphetamine -dextroamphetamine  (ADDERALL) 30 MG tablet Take 1 tablet by mouth daily before breakfast. 30 tablet 0    busPIRone  (BUSPAR ) 5 MG tablet Take 1-3 tablets (5-15 mg total) by mouth 2 (two) times daily. 540 tablet 1  colchicine  0.6 MG tablet TAKE 1 TABLET (0.6 MG TOTAL) BY MOUTH DAILY AS NEEDED (GOUT OR PSUEDOGOUT PAIN). 90 tablet 3    predniSONE  (DELTASONE ) 50 MG tablet Take 1 tablet (50 mg total) by mouth daily with breakfast. 5 tablet 0    tadalafil  (CIALIS ) 20 MG tablet Take 0.5-1 tablets (10-20 mg total) by mouth every other day as needed for erectile dysfunction. 10 tablet 11     Patient Stressors: Marital or family conflict    Patient Strengths: Average or above average intelligence  Capable of independent living  Communication skills  Physical Health  Supportive family/friends   Treatment Modalities: Medication Management, Group therapy, Case management,  1 to 1 session with clinician, Psychoeducation, Recreational therapy.   Physician Treatment Plan for Primary Diagnosis: Psychosis, unspecified psychosis type (HCC) Long Term Goal(s):     Short Term Goals: Ability to identify changes in lifestyle  to reduce recurrence of condition will improve Ability to verbalize feelings will improve  Medication Management: Evaluate patient's response, side effects, and tolerance of medication regimen.  Therapeutic Interventions: 1 to 1 sessions, Unit Group sessions and Medication administration.  Evaluation of Outcomes: Not Progressing  Physician Treatment Plan for Secondary Diagnosis: Principal Problem:   Psychosis, unspecified psychosis type (HCC)  Long Term Goal(s):     Short Term Goals: Ability to identify changes in lifestyle to reduce recurrence of condition will improve Ability to verbalize feelings will improve     Medication Management: Evaluate patient's response, side effects, and tolerance of medication regimen.  Therapeutic Interventions: 1 to 1 sessions, Unit Group sessions and Medication administration.  Evaluation of Outcomes: Not Progressing   RN Treatment Plan for Primary Diagnosis: Psychosis, unspecified psychosis type (HCC) Long Term Goal(s): Knowledge of disease and therapeutic regimen to maintain health will improve  Short Term Goals: Ability to remain free from injury will improve, Ability to verbalize frustration and anger appropriately will improve, Ability to demonstrate self-control, Ability to participate in decision making will improve, and Ability to verbalize feelings will improve  Medication Management: RN will administer medications as ordered by provider, will assess and evaluate patient's response and provide education to patient for prescribed medication. RN will report any adverse and/or side effects to prescribing provider.  Therapeutic Interventions: 1 on 1 counseling sessions, Psychoeducation, Medication administration, Evaluate responses to treatment, Monitor vital signs and CBGs as ordered, Perform/monitor CIWA, COWS, AIMS and Fall Risk screenings as ordered, Perform wound care treatments as ordered.  Evaluation of Outcomes: Not  Progressing   LCSW Treatment Plan for Primary Diagnosis: Psychosis, unspecified psychosis type (HCC) Long Term Goal(s): Safe transition to appropriate next level of care at discharge, Engage patient in therapeutic group addressing interpersonal concerns.  Short Term Goals: Engage patient in aftercare planning with referrals and resources, Increase social support, Increase ability to appropriately verbalize feelings, Increase emotional regulation, and Facilitate acceptance of mental health diagnosis and concerns  Therapeutic Interventions: Assess for all discharge needs, 1 to 1 time with Social worker, Explore available resources and support systems, Assess for adequacy in community support network, Educate family and significant other(s) on suicide prevention, Complete Psychosocial Assessment, Interpersonal group therapy.  Evaluation of Outcomes: Not Progressing   Progress in Treatment: Attending groups: No. Participating in groups: No. Taking medication as prescribed: Yes. Toleration medication: Yes. Family/Significant other contact made: No, will contact:  Lanette Chavous (s/o) [phone] Patient understands diagnosis: Yes. Discussing patient identified problems/goals with staff: Yes. Medical problems stabilized or resolved: Yes. Denies suicidal/homicidal ideation: Yes. Issues/concerns per  patient self-inventory: No.  Patient Goals:  I'm good  Discharge Plan or Barriers: Patient will likely discharge home once stable.  Reason for Continuation of Hospitalization: Delusions  Medication stabilization  Estimated Length of Stay: 5-7 days  Last 3 [place] Suicide Severity Risk Score: Flowsheet Row Ad[facility] (Current) [facility] from [facility] to Hosp-Admission (Discharged) from 10/13/2022 in [place] [facility]  C-SSRS RISK CATEGORY No Risk No Risk No Risk    Last  PHQ 2/9 Scores:    02/23/2024   11:02 AM 11/29/2023   10:33 AM 10/03/2023   10:39 AM  Depression screen PHQ 2/9  Decreased Interest 1 0 1  Down, Depressed, Hopeless 0 0 1  PHQ - 2 Score 1 0 2  Altered sleeping 0 0 1  Tired, decreased energy 1 1 3   Change in appetite 0 0 1  Feeling bad or failure about yourself  0 0 0  Trouble concentrating 0 1 2  Moving slowly or fidgety/restless 0 0 0  Suicidal thoughts 0 0 0  PHQ-9 Score 2 2 9   Difficult doing work/chores Somewhat difficult Not difficult at all Somewhat difficult    Scribe for Treatment Team: Apoorva Bugay M Sinda [place], [place] 02/27/2024 10:45 AM

## 2024-02-27 NOTE — Plan of Care (Signed)
  Problem: Education: Goal: Emotional status will improve Outcome: Progressing   Problem: Activity: Goal: Interest or engagement in activities will improve Outcome: Progressing   Problem: Education: Goal: Mental status will improve Outcome: Not Progressing   

## 2024-02-27 NOTE — [facility] Group Notes (Signed)
 Spirituality Group   Description: Participant directed exploration of values, beliefs and meaning **Focus on sources of hope & possibility from our own experiences  Following a brief framework of chaplain's role and ground rules of group behavior, participants are invited to share concerns or questions that engage spiritual life. Emphasis placed on common themes and shared experiences and ways to make meaning and clarify living into one's values.   Theory/Process/Goal: Utilize the theoretical framework of group therapy established by Celena Kite, Relational Cultural Theory and Rogerian approaches to facilitate relational empathy and use of the "here and now" to foster reflection, self-awareness, and sharing.   Observations: Brett Brown was reserved but clearly engaged (through expressions, reactions) with the group discussion.  Gagandeep Pettet L. Delores HERO.Div
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
## 2024-02-27 NOTE — Progress Notes (Signed)
(  Sleep Hours) -7.5 (Any PRNs that were needed, meds refused, or side effects to meds)- trazodone (Any disturbances and when (visitation, over night)- not happy to take medication but eventually did (Concerns raised by the patient)- pt feels he needs an attorney (SI/HI/AVH)- denies all

## 2024-02-27 NOTE — [facility] Group Notes (Signed)
 [facility] Group Notes:  (Nursing/MHT/Case Management/Adjunct)  Date:  02/27/2024  Time:  12:36 PM  Type of Therapy:  Group Therapy  Participation Level:  Did Not Attend  Participation Quality:  N/A  Affect:  N/A  Cognitive:  N/A  Insight:  None  Engagement in Group:  N/A  Modes of Intervention:  Orientation  Summary of Progress/Problems:   Max Romano-McCall, LRT,CTRS Gaby Harney A Nikoloz Huy-McCall 02/27/2024, 12:36 PM

## 2024-02-27 NOTE — Group Note (Signed)
 Recreation Therapy Group Note   Group Topic:Communication  Group Date: 02/27/2024 Start Time: 0935 End Time: 1000 Facilitators: Anastacio Bua-McCall, LRT,CTRS Location: [address] Dayroom   Group Topic: Communication, Problem Solving   Goal Area(s) Addresses:  Patient will effectively listen to complete activity.  Patient will identify communication skills used to make activity successful.  Patient will identify how skills used during activity can be used to reach post d/c goals.    Behavioral Response:   Intervention: Building surveyor Activity - Geometric pattern cards, pencils, blank paper    Activity: Geometric Drawings.  Three volunteers from the peer group will be shown an abstract picture with a particular arrangement of geometrical shapes.  Each round, one 'speaker' will describe the pattern, as accurately as possible without revealing the image to the group.  The remaining group members will listen and draw the picture to reflect how it is described to them. Patients with the role of 'listener' cannot ask clarifying questions but, may request that the speaker repeat a direction. Once the drawings are complete, the presenter will show the rest of the group the picture and compare how close each person came to drawing the picture. LRT will facilitate a post-activity discussion regarding effective communication and the importance of planning, listening, and asking for clarification in daily interactions with others.  Education: Environmental consultant, Active listening, Support systems, Discharge planning  Education Outcome: Acknowledges understanding/In group clarification offered/Needs additional education.    Affect/Mood: N/A   Participation Level: Did not attend    Clinical Observations/Individualized Feedback:      Plan: Continue to engage patient in RT group sessions 2-3x/week.   Shelli Portilla-McCall, LRT,CTRS 02/27/2024 11:00 AM

## 2024-02-28 MED ORDER — IBUPROFEN 600 MG PO TABS
600.0000 mg | ORAL_TABLET | Freq: Three times a day (TID) | ORAL | Status: DC | PRN
  Administered 2024-02-28 – 2024-02-29 (×2): 600 mg via ORAL
  Filled 2024-02-28 (×2): qty 1

## 2024-02-28 MED ORDER — ACETAMINOPHEN 500 MG PO TABS: 1000.0000 mg | ORAL_TABLET | Freq: Three times a day (TID) | ORAL | Status: DC | PRN

## 2024-02-28 MED ORDER — ACETAMINOPHEN 500 MG PO TABS
1000.0000 mg | ORAL_TABLET | Freq: Three times a day (TID) | ORAL | Status: DC
  Administered 2024-02-28: 1000 mg via ORAL
  Filled 2024-02-28: qty 2

## 2024-02-28 MED ORDER — IBUPROFEN 600 MG PO TABS
600.0000 mg | ORAL_TABLET | Freq: Three times a day (TID) | ORAL | Status: DC
  Administered 2024-02-28: 600 mg via ORAL
  Filled 2024-02-28: qty 1

## 2024-02-28 NOTE — Progress Notes (Signed)
   02/28/24 0800  Psych Admission Type (Psych Patients Only)  Admission Status Involuntary  Psychosocial Assessment  Patient Complaints None  Eye Contact Fair  Facial Expression Animated  Affect Appropriate to circumstance  Speech Logical/coherent  Interaction Assertive  Motor Activity Other (Comment) (WDL)  Appearance/Hygiene Unremarkable  Behavior Characteristics Appropriate to situation  Mood Pleasant  Thought Process  Coherency WDL  Content WDL  Delusions None reported or observed  Perception WDL  Hallucination None reported or observed  Judgment Impaired  Confusion None  Danger to Self  Current suicidal ideation? Denies  Agreement Not to Harm Self Yes  Description of Agreement Verbal  Danger to Others  Danger to Others None reported or observed

## 2024-02-28 NOTE — Group Note (Signed)
 Date:  02/28/2024 Time:  3:55 PM  Group Topic/Focus:  Boundaries: The focus of this group is to discuss five boundary types and describes what healthy, porous, and rigid boundaries look like for each. This encourages clients to reflect on their boundaries, understand how they differ, and identify strengths and weaknesses.    Participation Level:  Did Not Attend   Huel Mall 02/28/2024, 3:55 PM
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
## 2024-02-28 NOTE — [facility] Suicide Risk Assessment (Signed)
 [facility] INPATIENT:  Family/Significant Other Suicide Prevention Education  Suicide Prevention Education:  Family/Significant Other Refusal to Support Patient after Discharge:  Suicide Prevention Education Not Provided:  Patient has identified home of family/significant other as the place the patient will be residing after discharge.  With written consent of the patient, two attempts were made to provide Suicide Prevention Education to [employer] (s/o) [phone], (name of family member/significant other).  This person indicates he/she will not be responsible for the patient after discharge.  Lanette stated she was under the impression pt would be in the hospital at least a week to get on psychiatric medications and be monitored. Will not pick pt up tomorrow at discharge. Would like to speak with MD regarding discharge. MD made aware. Lanette reports pt's parents may be able to pick pt up if discharge is 10/15.  Jenkins LULLA Primer 02/28/2024,3:02 PM

## 2024-02-28 NOTE — Group Note (Signed)
 Recreation Therapy Group Note   Group Topic:Leisure Education  Group Date: 02/28/2024 Start Time: 0930 End Time: 1005 Facilitators: Marypat Kimmet-McCall, LRT,CTRS Location: [address] Dayroom   Group Topic: Leisure Education  Goal Area(s) Addresses:  Patient will successfully identify positive leisure and recreation activities.  Patient will acknowledge benefits of participation in healthy leisure activities post discharge.    Behavioral Response:    Intervention: Competitive Group Game    Activity: Guess the Lyric. In groups or individually, patients will take turns spinning the spinner. Whatever category ([address] Dance, [place], [place], R&B and Hip Hop) the spinner lands on, the individual/group has to fill in the missing song lyric. The spinner also has a section where you can pick the category you want or steal a card from one of the other people/groups. The individual/group with the most cards at the end, wins the game.    Education:  Teacher, English as a foreign language, Leisure as Merchant navy officer, Programmer, applications, Building control surveyor   Education Outcome: Acknowledges education/In group clarification offered/Needs additional education   Affect/Mood: N/A   Participation Level: Did not attend    Clinical Observations/Individualized Feedback:      Plan: Continue to engage patient in RT group sessions 2-3x/week.   Tishina Lown-McCall, LRT,CTRS 02/28/2024 12:16 PM

## 2024-02-28 NOTE — Group Note (Signed)
 Date:  02/28/2024 Time:  9:32 AM  Group Topic/Focus:  Goals Group:   The focus of this group is to help patients establish daily goals to achieve during treatment and discuss how the patient can incorporate goal setting into their daily lives to aide in recovery. Patients worked independently on worksheets regarding SMART goals, and the group discussed Maslow's hierarchy and its importance for goal setting.   Participation Level:  Did Not Attend  Participation Quality:  N/A  Affect:  N/A  Cognitive:  N/A  Insight: None  Engagement in Group:  None  Modes of Intervention:  N/A  Additional Comments:  Patient did not attend goals group.  Kristi HERO Yanitza Shvartsman 02/28/2024, 9:32 AM

## 2024-02-28 NOTE — Progress Notes (Signed)
   02/28/24 2053  Psych Admission Type (Psych Patients Only)  Admission Status Involuntary  Psychosocial Assessment  Patient Complaints None  Eye Contact Fair  Facial Expression Animated  Affect Appropriate to circumstance  Speech Logical/coherent  Interaction Assertive  Motor Activity Other (Comment) (WDL)  Appearance/Hygiene Unremarkable  Behavior Characteristics Cooperative;Calm  Mood Pleasant  Thought Process  Coherency WDL  Content WDL  Delusions None reported or observed  Perception WDL  Hallucination None reported or observed  Judgment Impaired  Confusion None  Danger to Self  Current suicidal ideation? Denies  Agreement Not to Harm Self Yes  Description of Agreement Verbal  Danger to Others  Danger to Others None reported or observed

## 2024-02-28 NOTE — Plan of Care (Signed)
  Problem: Activity: Goal: Interest or engagement in activities will improve Outcome: Progressing   Problem: Coping: Goal: Ability to verbalize frustrations and anger appropriately will improve Outcome: Progressing   Problem: Physical Regulation: Goal: Ability to maintain clinical measurements within normal limits will improve Outcome: Progressing

## 2024-02-28 NOTE — Progress Notes (Addendum)
(  Sleep Hours) -4.75 (Any PRNs that were needed, meds refused, or side effects to meds)- prn trazodone @ 2102 (Any disturbances and when (visitation, over night)-none (Concerns raised by the patient)- none (SI/HI/AVH)- denies all  Pulse rate high this am, pt states his back is hurting

## 2024-02-28 NOTE — Group Note (Signed)
 LCSW Group Therapy Note   Group Date: 02/28/2024 Start Time: 1100 End Time: 1200   Participation:  did not attend  Type of Therapy:  Group Therapy  Topic: Healing Flames: Navigating Anger with Compassion  Objective:  Foster self-awareness and promote compassion toward oneself and others when dealing with anger.  Goals:  Help participants understand the underlying emotions and needs fueling anger. Provide coping strategies for healthier emotional expression and anger management.  Summary: This session explored anger as a volcano--an explosion driven by deeper feelings and unmet needs. Participants learned to identify anger triggers and underlying emotions, then practiced coping strategies like deep breathing, physical activity, and journaling. The group discussed healthy ways to manage anger before it escalates, using both personal reflection and shared experiences.  Therapeutic Modalities: Cognitive Behavioral Therapy (CBT): Challenging thoughts that fuel anger. Mindfulness: Increasing awareness of emotions and sensations.   Romaldo Saville O Elston Aldape, LCSWA 02/28/2024  12:40 PM

## 2024-02-28 NOTE — Plan of Care (Signed)
   Problem: Education: Goal: Emotional status will improve Outcome: Progressing Goal: Mental status will improve Outcome: Progressing Goal: Verbalization of understanding the information provided will improve Outcome: Progressing   Problem: Activity: Goal: Interest or engagement in activities will improve Outcome: Progressing

## 2024-02-29 ENCOUNTER — Ambulatory Visit: Payer: Self-pay | Admitting: Medical-Surgical

## 2024-02-29 DIAGNOSIS — F29 Unspecified psychosis not due to a substance or known physiological condition: Secondary | ICD-10-CM | POA: Diagnosis not present

## 2024-02-29 MED ORDER — NICOTINE POLACRILEX 4 MG MT GUM: 4.0000 mg | CHEWING_GUM | OROMUCOSAL | Status: AC

## 2024-02-29 MED ORDER — BUSPIRONE HCL 10 MG PO TABS: 10.0000 mg | ORAL_TABLET | Freq: Two times a day (BID) | ORAL | 0 refills | Status: AC

## 2024-02-29 MED ORDER — RISPERIDONE 2 MG PO TABS: 2.0000 mg | ORAL_TABLET | Freq: Every day | ORAL | 0 refills | Status: AC

## 2024-02-29 MED ORDER — TRAZODONE HCL 50 MG PO TABS: 50.0000 mg | ORAL_TABLET | Freq: Every evening | ORAL | 0 refills | Status: AC | PRN

## 2024-02-29 NOTE — Discharge Instructions (Signed)
-  Follow-up with your outpatient psychiatric provider (and therapist) -instructions on appointment date, time, and address (location) are provided to you in discharge paperwork.  -Take your psychiatric medications as prescribed at discharge - instructions are provided to you in the discharge paperwork  -Follow-up with outpatient primary care doctor and other specialists -for management of preventative medicine and any chronic medical disease.  -Recommend abstinence from alcohol, tobacco, cannabis, and other substances at discharge.   -If your psychiatric symptoms recur, worsen, or if you have severe side effects to your psychiatric medications, call your outpatient psychiatric provider, 911, 988 (national suicide hotline), go to [facility], or go to the nearest emergency department.  -If suicidal thoughts occur, call your outpatient psychiatric provider, 911, 988 (national suicide hotline), go to [facility], or go to the nearest emergency department.  Naloxone (Narcan) can help reverse an overdose when given to the victim quickly.  [facility] offers free naloxone kits and instructions/training on its use.  Add naloxone to your first aid kit and you can help save a life.   Pick up your free kit at the following locations:   [place]:  [facility], [address] [place] [address] [phone]) [facility] [address] [place] [address] [phone]) [facility] [address] [place] [address]  [place]: [facility] [address] [place] [address] ([phone]) [facility] [address] [place] [address] [phone])

## 2024-02-29 NOTE — Progress Notes (Addendum)
  [facility] Adult Case Management Discharge Plan :  Will you be returning to the same living situation after discharge:  Yes,  patient will be returning to address on file. At discharge, do you have transportation home?: Yes, patient's father, Olivia Royse, provided transportation at 11am.  Do you have the ability to pay for your medications: Yes,  patient has active health insurance.   Release of information consent forms completed and in the chart;  Patient's signature needed at discharge.  Patient to Follow up at:  Follow-up Information     [facility] Follow up on 03/08/2024.   Why: You have a hospital follow up appointment for therapy and medication management services on 03/08/24 at 8:30 am.  This will be a Virtual, telehealth appointment. Contact information: [address]  [address] [place] [address] [phone]                 Next level of care provider has access to [facility] Link:no  Safety Planning and Suicide Prevention discussed: Yes,  completed with Lanette Chavous (spouse) [phone].      Has patient been referred to the Quitline?: Yes, faxed/e-referral on 02/29/24 at 10:30am.  Patient has been referred for addiction treatment: No known substance use disorder.  Louetta Lame, LCSWA 02/29/2024, 10:35 AM

## 2024-02-29 NOTE — Plan of Care (Signed)
 Nurse discussed anxiety, depression and coping skills with patient.

## 2024-02-29 NOTE — Group Note (Signed)
 Date:  02/29/2024 Time:  9:57 AM  Group Topic/Focus:  Goals Group:   The focus of this group is to help patients establish daily goals to achieve during treatment and discuss how the patient can incorporate goal setting into their daily lives to aide in recovery.    Participation Level:  Active  Participation Quality:  Appropriate  Affect:  Appropriate  Cognitive:  Appropriate  Insight: Appropriate  Engagement in Group:  Improving  Modes of Intervention:  Orientation  Additional Comments:  He doesn't really know why he is here, just know that the cops where called by his wife. He states that he feels depressed based on this situation.  Tehya Leath M Ovetta Bazzano 02/29/2024, 9:57 AM

## 2024-02-29 NOTE — Progress Notes (Signed)
 Discharge Note:  Patient discharged home with family member.  Patient denied SI and HI.  Denied A/V hallucinations.  Patient stated he received all his belongings.  Patient stated he appreciated all assistance received from [facility] staff.  All required discharge information given.

## 2024-02-29 NOTE — Progress Notes (Signed)
(  Sleep Hours) - 7 (Any PRNs that were needed, meds refused, or side effects to meds)- PRN trazodone 50 mg given at pt request, no meds refused.  (Any disturbances and when (visitation, over night)- None  (Concerns raised by the patient)- None  (SI/HI/AVH)- Denies SI/HI/AVH

## 2024-02-29 NOTE — Progress Notes (Signed)
D:  Patient denied SI and HI, contracts for safety.  Denied A/V hallucinations.   A:  Medications administered per MD orders.  Emotional support and encouragement given patient. R:  Safety maintained with 15 minute checks.  

## 2024-02-29 NOTE — Transportation (Deleted)
 02/29/2024  Brett Brown DOB: 25-Jun-1980 MRN: [id number]   RIDER WAIVER AND RELEASE OF LIABILITY  For the purposes of helping with transportation needs, [facility] partners with outside transportation providers (taxi companies, [place], Catering manager.) to give [facility] patients or other approved people the choice of on-demand rides Public librarian) to our buildings for non-emergency visits.  By using [employer], I, the person signing this document, on behalf of myself and/or any legal minors (in my care using the [employer]), agree:  Science writer given to me are supplied by independent, outside transportation providers who do not work for, or have any affiliation with, [employer]. [place] is not a transportation company. [facility] has no control over the quality or safety of the rides I get using [employer]. [facility] has no control over whether any outside ride will happen on time or not. [facility] gives no guarantee on the reliability, quality, safety, or availability on any rides, or that no mistakes will happen. I know and accept that traveling by vehicle (car, truck, SVU, fleeta, bus, taxi, etc.) has risks of serious injuries such as disability, being paralyzed, and death. I know and agree the risk of using [employer] is mine alone, and not [employer]. [employer] are provided as is and as are available. The transportation providers are in charge for all inspections and care of the vehicles used to provide these rides. I agree not to take legal action against [facility], its agents, employees, officers, directors, representatives, insurers, attorneys, assigns, successors, subsidiaries, and affiliates at any time for any reasons related directly or indirectly to using [employer]. I also agree not to take legal action against [facility] or its affiliates for any injury, death, or damage to property caused by or related to  using [employer]. I have read this Waiver and Release of Liability, and I understand the terms used in it and their legal meaning. This Waiver is freely and voluntarily given with the understanding that my right (or any legal minors) to legal action against [place] [facility] relating to [employer] is knowingly given up to use these services.   I attest that I read the Ride Waiver and Release of Liability to Brett Brown, gave [name] the opportunity to ask questions and answered the questions asked (if any). I affirm that Brett Brown then provided consent for assistance with transportation.

## 2024-02-29 NOTE — [facility] Group Notes (Signed)
 Adult Psychoeducational Group Note  Date:  02/29/2024 Time:  9:14 AM  Group Topic/Focus:  Wrap-Up Group:   The focus of this group is to help patients review their daily goal of treatment and discuss progress on daily workbooks.  Participation Level:  Active  Participation Quality:  Appropriate  Affect:  Appropriate  Cognitive:  Appropriate  Insight: Appropriate  Engagement in Group:  Engaged  Modes of Intervention:  Discussion  Additional Comments:  Leroi said his day was a 7. His goal discharge.Coping skills sleep. His favorite part of the day evening sunset.  Lang Drilling Long 02/29/2024, 9:14 AM

## 2024-02-29 NOTE — Discharge Summary (Signed)
 Physician Discharge Summary Note  Patient:  Brett Brown is an 43 y.o., male MRN:  [id number] DOB:  Aug 06, 1980 Patient phone:  [phone] (home)  Patient address:   [address] [facility] [address],  Total Time spent with patient: 1 hour  Date of Admission:  02/25/2024 Date of Discharge: 02/29/2024  Brett Brown 43 y.o., male with past psychiatric history of anxiety and ADHD who presented to [facility] under IVC accompanied by [employer] with complaints of psychotic behaviors including paranoia, auditory hallucinations, delusions, bizarre behaviors and labile mood.  Patient initially arrived to [facility] on 10/10, and admitted to [facility] under IVC on 10/11 for acute safety concerns. PMHx is significant for gout, HTN.   Principal Problem: Psychosis, unspecified psychosis type [facility]) Discharge Diagnoses: Principal Problem:   Psychosis, unspecified psychosis type (HCC)   Past Psychiatric History: Current psychiatrist: None Current therapist: None Previous psychiatric diagnoses: Anxiety, PTSD Current psychiatric medications: Buspar  5 mg BID, adderall 30 mg qday (taking BID)  Psychiatric medication history/compliance: Inconsistent use of Lexapro  and Adderall, per wife Psychiatric hospitalization(s): None Psychotherapy history: In the distant past Neuromodulation history: None History of suicide (obtained from HPI): Wife reports previous history of suicidality, patient denies, none per chart review History of homicide or aggression (obtained in HPI): Patient has been making threatening gestures recently towards wife as documented above   Substance Abuse History: Alcohol: Previous severe alcohol use history stemming from 2020, last known drink was in November of last year Tobacco: Regular dip use Cannabis: Regular cannabis use, patient says he takes 2 puffs of THC vape at night IV drug use: Denies Prescription drug use: denied. Other illicit drugs: Denies  Rehab  history: denied.   Past Medical History: PCP: Sees [facility]. Medical diagnoses: HTN, ED, Gout, chronic lower back pain with sciatic involvement Medications: Given short course of prednisone , only use 1 pill 10/9, colchicine , allopurinol  Allergies: denied. Hospitalizations: denied. Surgeries: denied. Trauma: Repeated head trauma from MMA/football Seizures: denied.   Social History: Living situation: Lives with wife Education: Some college Occupational history: Currently working Marital status: Married, divorce papers served Children: None Legal: Denies Hotel manager: previous LEO   Access to firearms: yes, in gun safe   Family Psychiatric History: Psychiatric diagnoses: none. Suicide history: none.  Violence/aggression: none. Substance use history: denied.   Family Medical History:   None pertienent.      Hospital course: Per providers on first 48 hours patient was experiencing significant delusions and paranoia and was started on risperidone 2 mg at bedtime.  This provider started seeing patient on Monday and noticed that patient was not having any concerning behaviors despite spending almost 30 minutes with patient on Monday and not noticing any behavior concerning for psychosis.  On attending handoff on Tuesday new provider came in and also agreed on Tuesday that patient was not having any significant psychosis.  Based on this suspect the patient's psychosis cleared within 48 hours.  Suspect that this increases the likelihood that the psychosis was triggered by substances including long-term Adderall use including using more than he is prescribed as well as recently being prescribed prednisone  as well as possible component of marital issues also contributing.  Wife spoke with patient prior to discharge via phone call and said that he was back to his baseline.  She reported that this was her main concern and she feels better with him coming home now.  Furthermore she reported  concern around him hiding weapons but patient was able to  tell her where the weapon was, and the gun safe.  Wife is agreeable to patient coming back and felt that he was stable to leave and patient was instructed to continue the risperidone although patient was somewhat emphatic that he would not be taking it at discharge.  Encouraged patient to take Adderall as prescribed and even discuss discontinuing Adderall per discussion with his outpatient provider and encouraged patient to avoid prednisone  the future or at least be on antipsychotic if he is requiring prednisone .  Patient was having significant pain from a alleged bulging disc per patient and was given Tylenol  and ibuprofen  with improvement in his pain.  Patient did not appear to have any acute gout episode while in the hospital.  During the patient's hospitalization, patient had extensive initial psychiatric evaluation, and follow-up psychiatric evaluations every day.  Psychiatric diagnoses provided upon initial assessment:  Principal Problem:   Psychosis, unspecified psychosis type (HCC) Most likely substance induced   Psych meds: Start risperidone 2 mg once at bedtime for psychosis Started trazodone 50 mg once at bedtime as needed for insomnia   Patient's care was discussed during the interdisciplinary team meeting every day during the hospitalization.  The patient is not having side effects to prescribed psychiatric medication.  Gradually, patient started adjusting to milieu. The patient was evaluated each day by a clinical provider to ascertain response to treatment. Improvement was noted by the patient's report of decreasing symptoms, improved sleep and appetite, affect, medication tolerance, behavior, and participation in unit programming.  Patient was asked each day to complete a self inventory noting mood, mental status, pain, new symptoms, anxiety and concerns.   Symptoms were reported as significantly decreased or resolved  completely by discharge.  The patient reports that their mood is stable.  The patient denied having suicidal thoughts for more than 48 hours prior to discharge.  Patient denies having homicidal thoughts.  Patient denies having auditory hallucinations.  Patient denies any visual hallucinations or other symptoms of psychosis.  The patient was motivated to continue taking medication with a goal of continued improvement in mental health.   The patient reports their target psychiatric symptoms of psychosis responded well to the psychiatric medications, and the patient reports overall benefit other psychiatric hospitalization. Supportive psychotherapy was provided to the patient. The patient also participated in regular group therapy while hospitalized. Coping skills, problem solving as well as relaxation therapies were also part of the unit programming.  Labs were reviewed with the patient, and abnormal results were discussed with the patient.  The patient is able to verbalize their individual safety plan to this provider.  # It is recommended to the patient to continue psychiatric medications as prescribed, after discharge from the hospital.    # It is recommended to the patient to follow up with your outpatient psychiatric provider and PCP.  # It was discussed with the patient, the impact of alcohol, drugs, tobacco have been there overall psychiatric and medical wellbeing, and total abstinence from substance use was recommended the patient.ed.  # Prescriptions provided or sent directly to preferred pharmacy at discharge. Patient agreeable to plan. Given opportunity to ask questions. Appears to feel comfortable with discharge.    # In the event of worsening symptoms, the patient is instructed to call the crisis hotline, 911 and or go to the nearest ED for appropriate evaluation and treatment of symptoms. To follow-up with primary care provider for other medical issues, concerns and or health care  needs  # Patient  was discharged home with spouse with a plan to follow up as noted below.   On day of discharge, denies AVH or paranoia. Denies intent to harm self or others. Reported that his guns are stored in his safe. Said he spoke with his wife and they are both on same page now that he will be coming home. Says he does not want to go home w/ his parents. Say that he is looking forward to work. Tolerating risperidone. Pt denies extrapyramidal symptoms including dystonia (sudden spastic contractions of muscle groups), parkinsonism (bradykinesia, tremors, rigidity), and akathisia (severe restlessness). Reports sleeping okay and at baseline. Reports eating well. Mood and anxiety are at stable level.    Collateral, spouse, [phone]: reports that he does have two guns: a revolver and a shotgun at home that she knows are hidden. Says that he told her on Sunday that he would go to [place]  but she spoke with his parents who said that he told them that he wants to go back to work, meaning that he would stay in the area for now. She reports that she tried to get a 50B but it was declined. Reports that she has another hearing on 10/22. Says that she tried to visit yesterday but that she was not allowed in as she did not have the code. Reported that they were going to communicated for patient to give the code to her but that this did not work out. Says she is open to coming tonight if she can come. Says that she believes that the psychosis has been going on for some time and is skeptical that the adderal or prednisone  is related. Says that she spoke with him on Sunday and he was delusional at that time and she thinks that patient is just able to avoid talking about his delusions with us . Reports that he told family that he would not take medicine at discharge. Told her we would have patient call her.   Called her again after she spoke with patient. Says now that she feels like he is at his basleline after  talking to him and agrees that he is safe to leave. Also reports that she now knows where the guns are and that they are locked up. Says that this was her two concerns and now that they are resolved that she feels safe.    Physical Findings: AIMS: Facial and Oral Movements Muscles of Facial Expression: None Lips and Perioral Area: None Jaw: None Tongue: None,Extremity Movements Upper (arms, wrists, hands, fingers): None Lower (legs, knees, ankles, toes): None, Trunk Movements Neck, shoulders, hips: None, Global Judgements Severity of abnormal movements overall : None Incapacitation due to abnormal movements: None Patient's awareness of abnormal movements: No Awareness, Dental Status Current problems with teeth and/or dentures?: No Does patient usually wear dentures?: No Edentia?: No, Other Do movements disappear in sleep?: No, AIMS Total Score AIMS Total Score: 0   Musculoskeletal: Strength & Muscle Tone: within normal limits Gait & Station: normal Patient leans: N/A   Psychiatric Specialty Exam:  Presentation  General Appearance:  Appropriate for Environment  Eye Contact: Good  Speech: Normal Rate  Speech Volume: Normal  Handedness:No data recorded  Mood and Affect  Mood: Euthymic  Affect: Appropriate; Congruent; Full Range   Thought Process  Thought Processes: Coherent; Linear  Descriptions of Associations:Intact  Orientation:Full (Time, Place and Person)  Thought Content:Logical  History of Schizophrenia/Schizoaffective disorder:No  Duration of Psychotic Symptoms:Greater than six months (Wife endorses ongoing, worsening delusional and  paranoid content as well as AVH)  Hallucinations:Hallucinations: None  Ideas of Reference:None  Suicidal Thoughts:Suicidal Thoughts: No  Homicidal Thoughts:Homicidal Thoughts: No   Sensorium  Memory: Immediate Good; Recent Good; Remote Good  Judgment: Good  Insight: Good   Executive Functions   Concentration: Good  Attention Span: Good  Recall: Good  Fund of Knowledge: Good  Language: Good   Psychomotor Activity  Psychomotor Activity: Psychomotor Activity: Normal   Assets  Assets: Desire for Improvement; Housing; Vocational/Educational   Sleep  Sleep: Sleep: Good  Estimated Sleeping Duration (Last 24 Hours): 6.25-6.50 hours   Physical Exam: Physical Exam ROS Blood pressure 130/83, pulse 100, temperature (!) 97.3 F (36.3 C), temperature source Oral, resp. rate 18, height 6' 3 (1.905 m), weight 93.3 kg, SpO2 99%. Body mass index is 25.7 kg/m.   Social History   Tobacco Use  Smoking Status Never  Smokeless Tobacco Never   Tobacco Cessation:  A prescription for an FDA-approved tobacco cessation medication provided at discharge. Patient will take OTC nicotine gum at discharge.    Blood Alcohol level:  Lab Results  Component Value Date   [facility] <15 02/25/2024    Metabolic Disorder Labs:  Lab Results  Component Value Date   HGBA1C 5.1 02/27/2024   MPG 99.67 02/27/2024   Lab Results  Component Value Date   PROLACTIN 16.3 05/14/2021   Lab Results  Component Value Date   CHOL 165 02/27/2024   TRIG 71 02/27/2024   HDL 49 02/27/2024   CHOLHDL 3.3 02/27/2024   VLDL 14 02/27/2024   LDLCALC 101 (H) 02/27/2024   LDLCALC 85 02/25/2024    See Psychiatric Specialty Exam and Suicide Risk Assessment completed by Attending Physician prior to discharge.  Discharge destination:  Home  Is patient on multiple antipsychotic therapies at discharge:  No     Discharge Instructions     Diet - low sodium heart healthy   Complete by: As directed    Increase activity slowly   Complete by: As directed       Allergies as of 02/29/2024   No Known Allergies      Medication List     TAKE these medications      Indication  allopurinol  300 MG tablet Commonly known as: ZYLOPRIM  Take 1 tablet (300 mg total) by mouth daily.  Indication:  Gout   busPIRone  10 MG tablet Commonly known as: BUSPAR  Take 1 tablet (10 mg total) by mouth 2 (two) times daily. What changed:  medication strength how much to take  Indication: Anxiety Disorder   colchicine  0.6 MG tablet TAKE 1 TABLET (0.6 MG TOTAL) BY MOUTH DAILY AS NEEDED (GOUT OR PSUEDOGOUT PAIN).  Indication: Acute Gout Attacks   nicotine polacrilex 4 MG gum Commonly known as: NICORETTE Take 1 each (4 mg total) by mouth every 4 (four) hours while awake.  Indication: Nicotine Addiction   risperiDONE 2 MG tablet Commonly known as: RISPERDAL Take 1 tablet (2 mg total) by mouth at bedtime.  Indication: Schizophrenia   tadalafil  20 MG tablet Commonly known as: CIALIS  Take 0.5-1 tablets (10-20 mg total) by mouth every other day as needed for erectile dysfunction.  Indication: Erectile Dysfunction   traZODone 50 MG tablet Commonly known as: DESYREL Take 1 tablet (50 mg total) by mouth at bedtime as needed for sleep.  Indication: Major Depressive Disorder        Follow-up Information     [facility] Follow up on 03/08/2024.   Why: You have a hospital follow  up appointment for therapy and medication management services on 03/08/24 at 8:30 am.  This will be a Virtual, telehealth appointment. Contact information: [address]  [address] [place] [address] [phone]                 Follow-up recommendations:   Activity: as tolerated   Diet: heart healthy   Other: -Follow-up with your outpatient psychiatric provider -instructions on appointment date, time, and address (location) are provided to you in discharge paperwork.   -Take your psychiatric medications as prescribed at discharge - instructions are provided to you in the discharge paperwork   -Follow-up with outpatient primary care doctor and other specialists -for management of chronic medical disease, including: avoid prescribing prednisone    -Testing: Follow-up with outpatient provider for  abnormal lab results: n/a   -Recommend abstinence from alcohol, tobacco, and other illicit drug use at discharge.    -If your psychiatric symptoms recur, worsen, or if you have side effects to your psychiatric medications, call your outpatient psychiatric provider, 911, [phone] or go to the nearest emergency department.   -If suicidal thoughts recur, call your outpatient psychiatric provider, 911, 988 or go to the nearest emergency department.   Signed: Justino Cornish, MD 02/29/2024, 11:26 AM

## 2024-02-29 NOTE — [facility] Suicide Risk Assessment (Signed)
 Suicide Risk Assessment  Discharge Assessment    [facility] Discharge Suicide Risk Assessment   Principal Problem: Psychosis, unspecified psychosis type [facility]) Discharge Diagnoses: Principal Problem:   Psychosis, unspecified psychosis type (HCC)  Brett Brown 43 y.o., male with past psychiatric history of anxiety and ADHD who presented to [facility] under IVC accompanied by [employer] with complaints of psychotic behaviors including paranoia, auditory hallucinations, delusions, bizarre behaviors and labile mood.  Patient initially arrived to [facility] on 10/10, and admitted to [facility] under IVC on 10/11 for acute safety concerns. PMHx is significant for gout, HTN.   On day of discharge, denies AVH or paranoia. Denies intent to harm self or others. Reported that his guns are stored in his safe. Said he spoke with his wife and they are both on same page now that he will be coming home. Says he does not want to go home w/ his parents. Say that he is looking forward to work. Tolerating risperidone. Pt denies extrapyramidal symptoms including dystonia (sudden spastic contractions of muscle groups), parkinsonism (bradykinesia, tremors, rigidity), and akathisia (severe restlessness). Reports sleeping okay and at baseline. Reports eating well. Mood and anxiety are at stable level.   Collateral, spouse, [phone]: reports that he does have two guns: a revolver and a shotgun at home that she knows are hidden. Says that he told her on Sunday that he would go to [place]  but she spoke with his parents who said that he told them that he wants to go back to work, meaning that he would stay in the area for now. She reports that she tried to get a 50B but it was declined. Reports that she has another hearing on 10/22. Says that she tried to visit yesterday but that she was not allowed in as she did not have the code. Reported that they were going to communicated for patient to give the code to her but that this did not work out.  Says she is open to coming tonight if she can come. Says that she believes that the psychosis has been going on for some time and is skeptical that the adderal or prednisone  is related. Says that she spoke with him on Sunday and he was delusional at that time and she thinks that patient is just able to avoid talking about his delusions with us . Reports that he told family that he would not take medicine at discharge. Told her we would have patient call her. Called her again after she spoke with patient. Says now that she feels like he is at his basleline after talking to him and agrees that he is safe to leave. Also reports that she now knows where the guns are and that they are locked up. Says that this was her two concerns and now that they are resolved that she feels safe.   Total Time spent with patient: 1 hour  Musculoskeletal: Strength & Muscle Tone: within normal limits Gait & Station: normal Patient leans: N/A  Psychiatric Specialty Exam  Presentation  General Appearance:  Appropriate for Environment  Eye Contact: Good  Speech: Normal Rate  Speech Volume: Normal  Handedness:No data recorded  Mood and Affect  Mood: Euthymic  Duration of Depression Symptoms: No data recorded Affect: Appropriate; Congruent; Full Range   Thought Process  Thought Processes: Coherent; Linear  Descriptions of Associations:Intact  Orientation:Full (Time, Place and Person)  Thought Content:Logical  History of Schizophrenia/Schizoaffective disorder:No  Duration of Psychotic Symptoms:Greater than six months (Wife endorses ongoing, worsening delusional  and paranoid content as well as AVH)  Hallucinations:Hallucinations: None  Ideas of Reference:None  Suicidal Thoughts:Suicidal Thoughts: No  Homicidal Thoughts:Homicidal Thoughts: No   Sensorium  Memory: Immediate Good; Recent Good; Remote Good  Judgment: Good  Insight: Good   Executive Functions   Concentration: Good  Attention Span: Good  Recall: Good  Fund of Knowledge: Good  Language: Good   Psychomotor Activity  Psychomotor Activity: Psychomotor Activity: Normal   Assets  Assets: Desire for Improvement; Housing; Vocational/Educational   Sleep  Sleep: Sleep: Good  Estimated Sleeping Duration (Last 24 Hours): 6.25-6.50 hours  Physical Exam: Physical Exam Vitals and nursing note reviewed.  HENT:     Head: Normocephalic and atraumatic.  Pulmonary:     Effort: Pulmonary effort is normal.  Neurological:     General: No focal deficit present.     Mental Status: He is alert.  Psychiatric:     Comments: No obvious EPS.    Review of Systems  Constitutional:  Negative for fever.  Cardiovascular:  Negative for chest pain and palpitations.  Gastrointestinal:  Negative for constipation, diarrhea, nausea and vomiting.  Neurological:  Negative for dizziness, weakness and headaches.  Psychiatric/Behavioral:         Pt denies extrapyramidal symptoms including dystonia (sudden spastic contractions of muscle groups), parkinsonism (bradykinesia, tremors, rigidity), and akathisia (severe restlessness).   Blood pressure 130/83, pulse 100, temperature (!) 97.3 F (36.3 C), temperature source Oral, resp. rate 18, height 6' 3 (1.905 m), weight 93.3 kg, SpO2 99%. Body mass index is 25.7 kg/m.  Mental Status Per Nursing Assessment::   On Admission:  NA  Demographic Factors:  Male and Caucasian  Loss Factors: NA  Historical Factors: NA  Risk Reduction Factors:   Sense of responsibility to family, Employed, Living with another person, especially a relative, Positive social support, Positive therapeutic relationship, and Positive coping skills or problem solving skills  Continued Clinical Symptoms:  Mood is stable. Anxiety at a manageable level. Denying any SI including passive SI.   Cognitive Features That Contribute To Risk:  None    Suicide Risk:   Mild:  There are no identifiable suicide plans, no associated intent, mild dysphoria and related symptoms, good self-control (both objective and subjective assessment), few other risk factors, and identifiable protective factors, including available and accessible social support.    Follow-up Information     [facility] Follow up on 03/08/2024.   Why: You have a hospital follow up appointment for therapy and medication management services on 03/08/24 at 8:30 am.  This will be a Virtual, telehealth appointment. Contact information: [address]  [address] [place] [address] [phone]                 Plan Of Care/Follow-up recommendations:  Activity: as tolerated  Diet: heart healthy  Other: -Follow-up with your outpatient psychiatric provider -instructions on appointment date, time, and address (location) are provided to you in discharge paperwork.  -Take your psychiatric medications as prescribed at discharge - instructions are provided to you in the discharge paperwork  -Follow-up with outpatient primary care doctor and other specialists -for management of chronic medical disease, including: avoid prescribing prednisone   -Testing: Follow-up with outpatient provider for abnormal lab results: n/a  -Recommend abstinence from alcohol, tobacco, and other illicit drug use at discharge.   -If your psychiatric symptoms recur, worsen, or if you have side effects to your psychiatric medications, call your outpatient psychiatric provider, 911, [phone] or go to the nearest emergency department.  -  If suicidal thoughts recur, call your outpatient psychiatric provider, 911, [phone] or go to the nearest emergency department.   Justino Cornish, MD 02/29/2024, 10:51 AM

## 2024-03-16 ENCOUNTER — Ambulatory Visit (INDEPENDENT_AMBULATORY_CARE_PROVIDER_SITE_OTHER)

## 2024-03-16 ENCOUNTER — Ambulatory Visit
Admission: EM | Admit: 2024-03-16 | Discharge: 2024-03-16 | Disposition: A | Discharge status: Home / Self Care | Attending: Family Medicine | Admitting: Family Medicine

## 2024-03-16 ENCOUNTER — Encounter: Payer: Self-pay | Admitting: Emergency Medicine

## 2024-03-16 DIAGNOSIS — M419 Scoliosis, unspecified: Secondary | ICD-10-CM | POA: Diagnosis not present

## 2024-03-16 DIAGNOSIS — M545 Low back pain, unspecified: Secondary | ICD-10-CM | POA: Diagnosis not present

## 2024-03-16 DIAGNOSIS — M47816 Spondylosis without myelopathy or radiculopathy, lumbar region: Secondary | ICD-10-CM | POA: Diagnosis not present

## 2024-03-16 DIAGNOSIS — Z043 Encounter for examination and observation following other accident: Secondary | ICD-10-CM | POA: Diagnosis not present

## 2024-03-16 MED ORDER — NAPROXEN 500 MG PO TABS: 500.0000 mg | ORAL_TABLET | Freq: Two times a day (BID) | ORAL | 0 refills | Status: AC | PRN

## 2024-03-16 MED ORDER — TIZANIDINE HCL 4 MG PO CAPS: 4.0000 mg | ORAL_CAPSULE | Freq: Three times a day (TID) | ORAL | 0 refills | Status: AC | PRN

## 2024-03-16 NOTE — ED Provider Notes (Signed)
 [facility]    CSN: [id number] Arrival date & time: 03/16/24  0946      History   Chief Complaint No chief complaint on file.   HPI Brett Brown is a 43 y.o. male.   Patient presenting today with new onset midline low back pain after a fall directly backward off of a hammock yesterday.  Denies loss of range of motion, numbness tingling or weakness of extremities, bowel or bladder incontinence, saddle anesthesias, fevers, urinary symptoms.  So far trying a back brace and over-the-counter pain relievers with mild temporary benefit.    Past Medical History:  Diagnosis Date   ADHD    Hypertension     Patient Active Problem List   Diagnosis Date Noted   Psychosis, unspecified psychosis type (HCC) 02/25/2024   Idiopathic chronic gout without tophus 12/08/2021   Testosterone  deficiency in male 01/05/2021   ED (erectile dysfunction) 06/18/2019   GAD (generalized anxiety disorder) 10/05/2018   Primary hypertension 04/19/2018   ADHD 04/19/2018    Past Surgical History:  Procedure Laterality Date   CYSTOSCOPY N/A 10/14/2022   Procedure: CYSTOSCOPY with removal of urethral foreign body;  Surgeon: Roseann Adine PARAS., MD;  Location: [facility];  Service: Urology;  Laterality: N/A;   ROTATOR CUFF REPAIR Right 2018   thumb surg near amputation retached Right 03/29/2018   [facility]       Home Medications    Prior to Admission medications   Medication Sig Start Date End Date Taking? Authorizing Provider  naproxen (NAPROSYN) 500 MG tablet Take 1 tablet (500 mg total) by mouth 2 (two) times daily as needed. 03/16/24  Yes Stuart Vernell Norris, PA-C  tiZANidine (ZANAFLEX) 4 MG capsule Take 1 capsule (4 mg total) by mouth 3 (three) times daily as needed for muscle spasms. Do not drink alcohol or drive while taking this medication.  May cause drowsiness. 03/16/24  Yes Stuart Vernell Norris, PA-C  allopurinol  (ZYLOPRIM ) 300 MG tablet Take 1 tablet  (300 mg total) by mouth daily. 03/16/23   Willo Mini, NP  busPIRone  (BUSPAR ) 10 MG tablet Take 1 tablet (10 mg total) by mouth 2 (two) times daily. 02/29/24   Cornelius Dines, MD  colchicine  0.6 MG tablet TAKE 1 TABLET (0.6 MG TOTAL) BY MOUTH DAILY AS NEEDED (GOUT OR PSUEDOGOUT PAIN). 12/22/21   Corey, Evan S, MD  nicotine polacrilex (NICORETTE) 4 MG gum Take 1 each (4 mg total) by mouth every 4 (four) hours while awake. 02/29/24   McCarty, Artie, MD  risperiDONE (RISPERDAL) 2 MG tablet Take 1 tablet (2 mg total) by mouth at bedtime. 02/29/24   Cornelius Dines, MD  tadalafil  (CIALIS ) 20 MG tablet Take 0.5-1 tablets (10-20 mg total) by mouth every other day as needed for erectile dysfunction. 03/16/23   Willo Mini, NP  traZODone (DESYREL) 50 MG tablet Take 1 tablet (50 mg total) by mouth at bedtime as needed for sleep. 02/29/24   Cornelius Dines, MD    Family History Family History  Problem Relation Age of Onset   Healthy Mother    Hypertension Father    Cancer Father 53       bladder cancer   Bladder Cancer Father    Healthy Sister    Healthy Brother    Thyroid  disease Neg Hx     Social History Social History   Tobacco Use   Smoking status: Never   Smokeless tobacco: Never  Vaping Use   Vaping status: Never Used  Substance  Use Topics   Alcohol use: No   Drug use: No     Allergies   Patient has no known allergies.   Review of Systems Review of Systems PER HPI  Physical Exam Triage Vital Signs ED Triage Vitals  Encounter Vitals Group     BP 03/16/24 0952 109/76     Girls Systolic BP Percentile --      Girls Diastolic BP Percentile --      Boys Systolic BP Percentile --      Boys Diastolic BP Percentile --      Pulse Rate 03/16/24 0952 83     Resp 03/16/24 0952 18     Temp 03/16/24 0952 97.9 F (36.6 C)     Temp Source 03/16/24 0952 Oral     SpO2 03/16/24 0952 97 %     Weight --      Height --      Head Circumference --      Peak Flow --      Pain Score  03/16/24 0953 4     Pain Loc --      Pain Education --      Exclude from Growth Chart --    No data found.  Updated Vital Signs BP 109/76 (BP Location: Right Arm)   Pulse 83   Temp 97.9 F (36.6 C) (Oral)   Resp 18   SpO2 97%   Visual Acuity Right Eye Distance:   Left Eye Distance:   Bilateral Distance:    Right Eye Near:   Left Eye Near:    Bilateral Near:     Physical Exam Vitals and nursing note reviewed.  Constitutional:      Appearance: Normal appearance.  HENT:     Head: Atraumatic.     Mouth/Throat:     Mouth: Mucous membranes are moist.  Eyes:     Extraocular Movements: Extraocular movements intact.     Conjunctiva/sclera: Conjunctivae normal.  Cardiovascular:     Rate and Rhythm: Normal rate.  Pulmonary:     Effort: Pulmonary effort is normal.  Musculoskeletal:        General: Normal range of motion.     Cervical back: Normal range of motion and neck supple.     Comments: Range of motion intact diffusely though antalgic movements.  Midline lumbar spinal tenderness to palpation without bony deformity palpable, otherwise no midline spinal tenderness to palpation diffusely.  Negative straight leg raise bilateral lower extremities.  Skin:    General: Skin is warm and dry.  Neurological:     Mental Status: He is oriented to person, place, and time.     Comments: Bilateral lower extremities neurovascularly intact  Psychiatric:        Mood and Affect: Mood normal.        Thought Content: Thought content normal.        Judgment: Judgment normal.      UC Treatments / Results  Labs (all labs ordered are listed, but only abnormal results are displayed) Labs Reviewed - No data to display  EKG   Radiology DG Lumbar Spine Complete Result Date: 03/16/2024 EXAM: 4 VIEW(S) XRAY OF THE LUMBAR SPINE 03/16/2024 10:31:11 AM COMPARISON: 02/23/2024 CLINICAL HISTORY: midline lumbar pain after falling directly onto low back when a hammock he was in fell yesterday  FINDINGS: LUMBAR SPINE: BONES: Minimal levoscoliosis of the lumbar spine is noted. No acute fracture. No aggressive appearing osseous lesion. DISCS AND DEGENERATIVE CHANGES: Minimal degenerative disc disease is  noted at L2-L3 and L3-L4. SOFT TISSUES: No acute abnormality. IMPRESSION: 1. No acute abnormality of the lumbar spine. 2. Minimal levoscoliosis. 3. Minimal degenerative disc disease at L2-3 and L3-4. Electronically signed by: Lynwood Seip MD 03/16/2024 11:11 AM EDT RP Workstation: [id number]    Procedures Procedures (including critical care time)  Medications Ordered in UC Medications - No data to display  Initial Impression / Assessment and Plan / UC Course  I have reviewed the triage vital signs and the nursing notes.  Pertinent labs & imaging results that were available during my care of the patient were reviewed by me and considered in my medical decision making (see chart for details).     X-ray of the lumbar spine negative for acute bony abnormality.  Discussed naproxen, Zanaflex, heat,/, stretches, rest.  Return for worsening or unresolving symptoms.  No red flag findings today.  Work note given.  Final Clinical Impressions(s) / UC Diagnoses   Final diagnoses:  Acute midline low back pain without sciatica     Discharge Instructions      Your x-ray was negative for bony spinal injury which is great news.  I have sent in a muscle relaxer, anti-inflammatory pain medication and you may use heat, massage, Epsom salt soaks, rest.  Follow-up with orthopedics if worsening or not resolving    ED Prescriptions     Medication Sig Dispense Auth. Provider   naproxen (NAPROSYN) 500 MG tablet Take 1 tablet (500 mg total) by mouth 2 (two) times daily as needed. 20 tablet Stuart Vernell Norris, PA-C   tiZANidine (ZANAFLEX) 4 MG capsule Take 1 capsule (4 mg total) by mouth 3 (three) times daily as needed for muscle spasms. Do not drink alcohol or drive while taking this medication.   May cause drowsiness. 15 capsule Stuart Vernell Norris, [place]      PDMP not reviewed this encounter.   Stuart Vernell Norris, [place] 03/16/24 1434

## 2024-03-16 NOTE — Discharge Instructions (Signed)
 Your x-ray was negative for bony spinal injury which is great news.  I have sent in a muscle relaxer, anti-inflammatory pain medication and you may use heat, massage, Epsom salt soaks, rest.  Follow-up with orthopedics if worsening or not resolving

## 2024-03-16 NOTE — ED Triage Notes (Signed)
 Clemens out of hammock yesterday and has lower back pain.

## 2024-06-06 ENCOUNTER — Other Ambulatory Visit: Payer: Self-pay

## 2024-06-06 ENCOUNTER — Encounter: Payer: Self-pay | Admitting: Emergency Medicine

## 2024-06-06 ENCOUNTER — Ambulatory Visit
Admission: EM | Admit: 2024-06-06 | Discharge: 2024-06-06 | Disposition: A | Discharge status: Home / Self Care | Attending: Nurse Practitioner | Admitting: Nurse Practitioner

## 2024-06-06 DIAGNOSIS — J069 Acute upper respiratory infection, unspecified: Secondary | ICD-10-CM | POA: Diagnosis not present

## 2024-06-06 DIAGNOSIS — J029 Acute pharyngitis, unspecified: Secondary | ICD-10-CM | POA: Diagnosis not present

## 2024-06-06 LAB — POCT RAPID STREP A (OFFICE): Rapid Strep A Screen: NEGATIVE

## 2024-06-06 LAB — POC COVID19/FLU A&B COMBO
Covid Antigen, POC: NEGATIVE
Influenza A Antigen, POC: NEGATIVE
Influenza B Antigen, POC: NEGATIVE

## 2024-06-06 MED ORDER — ALBUTEROL SULFATE HFA 108 (90 BASE) MCG/ACT IN AERS: 2.0000 | INHALATION_SPRAY | Freq: Four times a day (QID) | RESPIRATORY_TRACT | 0 refills | Status: AC | PRN

## 2024-06-06 MED ORDER — PROMETHAZINE-DM 6.25-15 MG/5ML PO SYRP: 5.0000 mL | ORAL_SOLUTION | Freq: Four times a day (QID) | ORAL | 0 refills | Status: AC | PRN

## 2024-06-06 NOTE — ED Triage Notes (Addendum)
 Pt reports sore throat, fatigue, chills, cough, nausea, body aches, diarrhea since Saturday. Has not taken anything otc today. Has had flu exposure at work.

## 2024-06-06 NOTE — ED Provider Notes (Signed)
 " [facility]    CSN: [id number] Arrival date & time: 06/06/24  1413      History   Chief Complaint Chief Complaint  Patient presents with   Sore Throat    HPI Brett Brown is a 44 y.o. male.   The history is provided by the patient.   Patient presents with a several day history of sore throat, fatigue, chills, cough, nausea, shortness of breath, and bodyaches.  Patient denies fever, but states that he woke up sweating over the course of his symptoms.  He reports 1 episode of diarrhea that has since resolved.  He denies ear pain, ear drainage, wheezing, difficulty breathing, abdominal pain, nausea, vomiting, or rash.  Patient states that he has been taking over-the-counter cough and cold medications for symptoms.  States that he was around someone who had influenza at work.  Past Medical History:  Diagnosis Date   ADHD    Hypertension     Patient Active Problem List   Diagnosis Date Noted   Psychosis, unspecified psychosis type (HCC) 02/25/2024   Idiopathic chronic gout without tophus 12/08/2021   Testosterone  deficiency in male 01/05/2021   ED (erectile dysfunction) 06/18/2019   GAD (generalized anxiety disorder) 10/05/2018   Primary hypertension 04/19/2018   ADHD 04/19/2018    Past Surgical History:  Procedure Laterality Date   CYSTOSCOPY N/A 10/14/2022   Procedure: CYSTOSCOPY with removal of urethral foreign body;  Surgeon: Roseann Adine PARAS., MD;  Location: [facility];  Service: Urology;  Laterality: N/A;   ROTATOR CUFF REPAIR Right 2018   thumb surg near amputation retached Right 03/29/2018   [facility]       Home Medications    Prior to Admission medications  Medication Sig Start Date End Date Taking? Authorizing Provider  albuterol  (VENTOLIN  HFA) 108 (90 Base) MCG/ACT inhaler Inhale 2 puffs into the lungs every 6 (six) hours as needed. 06/06/24  Yes Leath-Warren, Etta PARAS, NP  promethazine -dextromethorphan (PROMETHAZINE -DM)  6.25-15 MG/5ML syrup Take 5 mLs by mouth 4 (four) times daily as needed. 06/06/24  Yes Leath-Warren, Etta PARAS, NP  allopurinol  (ZYLOPRIM ) 300 MG tablet Take 1 tablet (300 mg total) by mouth daily. 03/16/23   Willo Mini, NP  busPIRone  (BUSPAR ) 10 MG tablet Take 1 tablet (10 mg total) by mouth 2 (two) times daily. 02/29/24   Cornelius Dines, MD  colchicine  0.6 MG tablet TAKE 1 TABLET (0.6 MG TOTAL) BY MOUTH DAILY AS NEEDED (GOUT OR PSUEDOGOUT PAIN). 12/22/21   Joane Artist RAMAN, MD  naproxen  (NAPROSYN ) 500 MG tablet Take 1 tablet (500 mg total) by mouth 2 (two) times daily as needed. 03/16/24   Stuart Vernell Norris, PA-C  nicotine  polacrilex (NICORETTE ) 4 MG gum Take 1 each (4 mg total) by mouth every 4 (four) hours while awake. 02/29/24   Cornelius Dines, MD  risperiDONE  (RISPERDAL ) 2 MG tablet Take 1 tablet (2 mg total) by mouth at bedtime. 02/29/24   Cornelius Dines, MD  tadalafil  (CIALIS ) 20 MG tablet Take 0.5-1 tablets (10-20 mg total) by mouth every other day as needed for erectile dysfunction. 03/16/23   Willo Mini, NP  tiZANidine  (ZANAFLEX ) 4 MG capsule Take 1 capsule (4 mg total) by mouth 3 (three) times daily as needed for muscle spasms. Do not drink alcohol or drive while taking this medication.  May cause drowsiness. 03/16/24   Stuart Vernell Norris, PA-C  traZODone  (DESYREL ) 50 MG tablet Take 1 tablet (50 mg total) by mouth at bedtime as needed for  sleep. 02/29/24   Cornelius Dines, MD    Family History Family History  Problem Relation Age of Onset   Healthy Mother    Hypertension Father    Cancer Father 80       bladder cancer   Bladder Cancer Father    Healthy Sister    Healthy Brother    Thyroid  disease Neg Hx     Social History Social History[1]   Allergies   Patient has no known allergies.   Review of Systems Review of Systems Per HPI  Physical Exam Triage Vital Signs ED Triage Vitals  Encounter Vitals Group     BP 06/06/24 1435 118/85     Girls Systolic BP  Percentile --      Girls Diastolic BP Percentile --      Boys Systolic BP Percentile --      Boys Diastolic BP Percentile --      Pulse Rate 06/06/24 1435 98     Resp 06/06/24 1435 20     Temp 06/06/24 1435 98.4 F (36.9 C)     Temp Source 06/06/24 1435 Oral     SpO2 06/06/24 1435 98 %     Weight --      Height --      Head Circumference --      Peak Flow --      Pain Score 06/06/24 1436 0     Pain Loc --      Pain Education --      Exclude from Growth Chart --    No data found.  Updated Vital Signs BP 118/85 (BP Location: Right Arm)   Pulse 98   Temp 98.4 F (36.9 C) (Oral)   Resp 20   SpO2 98%   Visual Acuity Right Eye Distance:   Left Eye Distance:   Bilateral Distance:    Right Eye Near:   Left Eye Near:    Bilateral Near:     Physical Exam Vitals and nursing note reviewed.  Constitutional:      General: He is not in acute distress.    Appearance: Normal appearance. He is well-developed.  HENT:     Head: Normocephalic and atraumatic.     Right Ear: Tympanic membrane, ear canal and external ear normal.     Left Ear: Tympanic membrane, ear canal and external ear normal.     Nose: Nose normal.     Right Turbinates: Enlarged and swollen.     Left Turbinates: Enlarged and swollen.     Right Sinus: No maxillary sinus tenderness or frontal sinus tenderness.     Left Sinus: No maxillary sinus tenderness or frontal sinus tenderness.     Mouth/Throat:     Lips: Pink.     Mouth: Mucous membranes are moist.     Pharynx: Uvula midline. Posterior oropharyngeal erythema and postnasal drip present. No pharyngeal swelling, oropharyngeal exudate or uvula swelling.     Comments: Cobblestoning present to posterior oropharynx  Eyes:     Extraocular Movements: Extraocular movements intact.     Conjunctiva/sclera: Conjunctivae normal.     Pupils: Pupils are equal, round, and reactive to light.  Neck:     Thyroid : No thyromegaly.     Trachea: No tracheal deviation.   Cardiovascular:     Rate and Rhythm: Normal rate and regular rhythm.     Pulses: Normal pulses.     Heart sounds: Normal heart sounds.  Pulmonary:     Effort: Pulmonary effort is normal.  No respiratory distress.     Breath sounds: Normal breath sounds. No stridor. No wheezing, rhonchi or rales.  Abdominal:     General: Bowel sounds are normal.     Palpations: Abdomen is soft.     Tenderness: There is no abdominal tenderness.  Musculoskeletal:     Cervical back: Normal range of motion and neck supple.  Skin:    General: Skin is warm and dry.  Neurological:     General: No focal deficit present.     Mental Status: He is alert and oriented to person, place, and time.  Psychiatric:        Mood and Affect: Mood normal.        Behavior: Behavior normal.        Thought Content: Thought content normal.        Judgment: Judgment normal.      UC Treatments / Results  Labs (all labs ordered are listed, but only abnormal results are displayed) Labs Reviewed  POC COVID19/FLU A&B COMBO  POCT RAPID STREP A (OFFICE)    EKG   Radiology No results found.  Procedures Procedures (including critical care time)  Medications Ordered in UC Medications - No data to display  Initial Impression / Assessment and Plan / UC Course  I have reviewed the triage vital signs and the nursing notes.  Pertinent labs & imaging results that were available during my care of the patient were reviewed by me and considered in my medical decision making (see chart for details).  COVID/flu test was negative.  Rapid strep test was also negative.  On exam, the patient's lung sounds are clear throughout, room air sats are at 98%.  He is well-appearing, is in no acute distress, and vital signs are stable.  Symptoms consistent with a viral URI with cough.  Will provide symptomatic treatment with Promethazine  DM for the cough and an albuterol  inhaler as needed for shortness of breath.  Supportive care  recommendations were provided and discussed with the patient to include fluids, rest, over-the-counter analgesics, use of a humidifier, and warm salt water gargles.  Discussed indications with the patient regarding follow-up.  Patient was in agreement with this plan of care and verbalizes understanding.  All questions were answered.  Patient stable for discharge.  Work note was provided.   Final Clinical Impressions(s) / UC Diagnoses   Final diagnoses:  Sore throat  Viral URI with cough     Discharge Instructions      The COVID test and rapid strep test were negative. Take medication as prescribed. Increase fluids and allow for plenty of rest. You may take over-the-counter Tylenol  or ibuprofen  as needed for pain, fever, general discomfort. Warm salt water gargles 3-4 times daily as needed for throat pain or discomfort.  You may also use over-the-counter Chloraseptic throat spray or throat lozenges. Recommend use of a humidifier in your bedroom at nighttime during sleep and sleeping elevated on pillows while symptoms persist. Symptoms should improve over the next 5 to 7 days.  Your symptoms fail to improve, or begin to worsen, you may follow-up in this clinic or with your primary care physician for further evaluation. Follow-up as needed.     ED Prescriptions     Medication Sig Dispense Auth. Provider   promethazine -dextromethorphan (PROMETHAZINE -DM) 6.25-15 MG/5ML syrup Take 5 mLs by mouth 4 (four) times daily as needed. 118 mL Leath-Warren, Etta PARAS, NP   albuterol  (VENTOLIN  HFA) 108 (90 Base) MCG/ACT inhaler Inhale 2 puffs into  the lungs every 6 (six) hours as needed. 8 g Leath-Warren, Etta PARAS, NP      PDMP not reviewed this encounter.     [1]  Social History Tobacco Use   Smoking status: Never   Smokeless tobacco: Never  Vaping Use   Vaping status: Never Used  Substance Use Topics   Alcohol use: No   Drug use: No     Gilmer Etta PARAS, NP 06/06/24  1523  "

## 2024-06-06 NOTE — Discharge Instructions (Addendum)
 The COVID test and rapid strep test were negative. Take medication as prescribed. Increase fluids and allow for plenty of rest. You may take over-the-counter Tylenol  or ibuprofen  as needed for pain, fever, general discomfort. Warm salt water gargles 3-4 times daily as needed for throat pain or discomfort.  You may also use over-the-counter Chloraseptic throat spray or throat lozenges. Recommend use of a humidifier in your bedroom at nighttime during sleep and sleeping elevated on pillows while symptoms persist. Symptoms should improve over the next 5 to 7 days.  Your symptoms fail to improve, or begin to worsen, you may follow-up in this clinic or with your primary care physician for further evaluation. Follow-up as needed.

## 2024-06-14 ENCOUNTER — Encounter: Payer: Self-pay | Admitting: Medical-Surgical

## 2024-06-14 MED ORDER — AMPHETAMINE-DEXTROAMPHETAMINE 20 MG PO TABS: 20.0000 mg | ORAL_TABLET | Freq: Two times a day (BID) | ORAL | 0 refills | Status: AC

## 2024-08-23 ENCOUNTER — Ambulatory Visit: Admitting: Medical-Surgical
